# Patient Record
Sex: Female | Born: 1940
Health system: Southern US, Community
[De-identification: ages and names within clinical notes are randomized; demographics above are authoritative.]

## PROBLEM LIST (undated history)

## (undated) DIAGNOSIS — I10 Essential (primary) hypertension: Secondary | ICD-10-CM

## (undated) DIAGNOSIS — K219 Gastro-esophageal reflux disease without esophagitis: Secondary | ICD-10-CM

## (undated) DIAGNOSIS — M1990 Unspecified osteoarthritis, unspecified site: Secondary | ICD-10-CM

## (undated) DIAGNOSIS — M8580 Other specified disorders of bone density and structure, unspecified site: Secondary | ICD-10-CM

## (undated) DIAGNOSIS — E7800 Pure hypercholesterolemia, unspecified: Secondary | ICD-10-CM

## (undated) HISTORY — DX: Other specified disorders of bone density and structure, unspecified site: M85.80

## (undated) HISTORY — PX: VAGINAL HYSTERECTOMY: SUR661

## (undated) HISTORY — DX: Pure hypercholesterolemia, unspecified: E78.00

## (undated) HISTORY — DX: Essential (primary) hypertension: I10

---

## 1999-11-01 ENCOUNTER — Emergency Department (HOSPITAL_COMMUNITY): Admission: EM | Admit: 1999-11-01 | Discharge: 1999-11-01 | Payer: Self-pay | Admitting: Emergency Medicine

## 1999-11-02 ENCOUNTER — Encounter: Payer: Self-pay | Admitting: Emergency Medicine

## 2000-05-05 ENCOUNTER — Encounter: Payer: Self-pay | Admitting: Family Medicine

## 2000-05-05 ENCOUNTER — Encounter: Admission: RE | Admit: 2000-05-05 | Discharge: 2000-05-05 | Payer: Self-pay | Admitting: Family Medicine

## 2002-12-12 ENCOUNTER — Other Ambulatory Visit: Admission: RE | Admit: 2002-12-12 | Discharge: 2002-12-12 | Payer: Self-pay | Admitting: Family Medicine

## 2003-03-27 ENCOUNTER — Other Ambulatory Visit: Admission: RE | Admit: 2003-03-27 | Discharge: 2003-03-27 | Payer: Self-pay | Admitting: Family Medicine

## 2004-09-29 ENCOUNTER — Other Ambulatory Visit: Admission: RE | Admit: 2004-09-29 | Discharge: 2004-09-29 | Payer: Self-pay | Admitting: Addiction Medicine

## 2006-02-16 ENCOUNTER — Other Ambulatory Visit: Admission: RE | Admit: 2006-02-16 | Discharge: 2006-02-16 | Payer: Self-pay | Admitting: Obstetrics and Gynecology

## 2006-11-01 ENCOUNTER — Encounter: Admission: RE | Admit: 2006-11-01 | Discharge: 2006-11-01 | Payer: Self-pay | Admitting: Family Medicine

## 2007-04-04 ENCOUNTER — Other Ambulatory Visit: Admission: RE | Admit: 2007-04-04 | Discharge: 2007-04-04 | Payer: Self-pay | Admitting: Obstetrics and Gynecology

## 2008-05-08 ENCOUNTER — Encounter: Payer: Self-pay | Admitting: Obstetrics and Gynecology

## 2008-05-08 ENCOUNTER — Other Ambulatory Visit: Admission: RE | Admit: 2008-05-08 | Discharge: 2008-05-08 | Payer: Self-pay | Admitting: Obstetrics and Gynecology

## 2008-05-08 ENCOUNTER — Ambulatory Visit: Payer: Self-pay | Admitting: Obstetrics and Gynecology

## 2009-05-14 ENCOUNTER — Ambulatory Visit: Payer: Self-pay | Admitting: Obstetrics and Gynecology

## 2009-05-14 ENCOUNTER — Encounter: Payer: Self-pay | Admitting: Obstetrics and Gynecology

## 2009-05-14 ENCOUNTER — Other Ambulatory Visit: Admission: RE | Admit: 2009-05-14 | Discharge: 2009-05-14 | Payer: Self-pay | Admitting: Obstetrics and Gynecology

## 2009-05-21 ENCOUNTER — Encounter: Admission: RE | Admit: 2009-05-21 | Discharge: 2009-05-21 | Payer: Self-pay | Admitting: Internal Medicine

## 2009-05-22 ENCOUNTER — Ambulatory Visit: Payer: Self-pay | Admitting: Obstetrics and Gynecology

## 2009-06-18 ENCOUNTER — Ambulatory Visit: Payer: Self-pay | Admitting: Obstetrics and Gynecology

## 2010-04-09 ENCOUNTER — Observation Stay (HOSPITAL_COMMUNITY): Admission: EM | Admit: 2010-04-09 | Discharge: 2010-04-10 | Payer: Self-pay | Admitting: Emergency Medicine

## 2010-04-24 ENCOUNTER — Encounter: Admission: RE | Admit: 2010-04-24 | Discharge: 2010-04-24 | Payer: Self-pay | Admitting: Obstetrics and Gynecology

## 2010-04-28 ENCOUNTER — Other Ambulatory Visit: Admission: RE | Admit: 2010-04-28 | Discharge: 2010-04-28 | Payer: Self-pay | Admitting: Obstetrics and Gynecology

## 2010-04-28 ENCOUNTER — Ambulatory Visit: Payer: Self-pay | Admitting: Obstetrics and Gynecology

## 2010-09-18 LAB — CARDIAC PANEL(CRET KIN+CKTOT+MB+TROPI)
CK, MB: 0.9 ng/mL (ref 0.3–4.0)
CK, MB: 1.1 ng/mL (ref 0.3–4.0)
Relative Index: INVALID (ref 0.0–2.5)
Relative Index: INVALID (ref 0.0–2.5)
Total CK: 22 U/L (ref 7–177)
Total CK: 23 U/L (ref 7–177)
Troponin I: 0.01 ng/mL (ref 0.00–0.06)
Troponin I: 0.02 ng/mL (ref 0.00–0.06)

## 2010-09-18 LAB — DIFFERENTIAL
Basophils Absolute: 0 10*3/uL (ref 0.0–0.1)
Basophils Relative: 0 % (ref 0–1)
Eosinophils Absolute: 0.1 10*3/uL (ref 0.0–0.7)
Eosinophils Relative: 1 % (ref 0–5)
Lymphocytes Relative: 31 % (ref 12–46)
Lymphs Abs: 2.9 10*3/uL (ref 0.7–4.0)
Monocytes Absolute: 0.8 10*3/uL (ref 0.1–1.0)
Monocytes Relative: 8 % (ref 3–12)
Neutro Abs: 5.5 10*3/uL (ref 1.7–7.7)
Neutrophils Relative %: 60 % (ref 43–77)

## 2010-09-18 LAB — POCT CARDIAC MARKERS
CKMB, poc: <1 ng/mL — ABNORMAL LOW (ref 1.0–8.0)
CKMB, poc: <1 ng/mL — ABNORMAL LOW (ref 1.0–8.0)
Myoglobin, poc: 46 ng/mL (ref 12–200)
Myoglobin, poc: 56.1 ng/mL (ref 12–200)
Troponin i, poc: <0.05 ng/mL (ref 0.00–0.09)
Troponin i, poc: <0.05 ng/mL (ref 0.00–0.09)

## 2010-09-18 LAB — PROTIME-INR
INR: 0.83 (ref 0.00–1.49)
Prothrombin Time: 11.6 seconds (ref 11.6–15.2)

## 2010-09-18 LAB — CK TOTAL AND CKMB (NOT AT ARMC)
CK, MB: 1.1 ng/mL (ref 0.3–4.0)
Relative Index: INVALID (ref 0.0–2.5)
Total CK: 26 U/L (ref 7–177)

## 2010-09-18 LAB — LIPID PANEL
Cholesterol: 196 mg/dL (ref 0–200)
HDL: 57 mg/dL (ref >39)
LDL Cholesterol: UNDETERMINED mg/dL (ref 0–99)
Total CHOL/HDL Ratio: 3.4 RATIO
Triglycerides: 434 mg/dL — ABNORMAL HIGH (ref <150)
VLDL: UNDETERMINED mg/dL (ref 0–40)

## 2010-09-18 LAB — CBC
HCT: 42 % (ref 36.0–46.0)
Hemoglobin: 14.9 g/dL (ref 12.0–15.0)
MCH: 33 pg (ref 26.0–34.0)
MCHC: 35.5 g/dL (ref 30.0–36.0)
MCV: 92.9 fL (ref 78.0–100.0)
Platelets: 271 10*3/uL (ref 150–400)
RBC: 4.52 MIL/uL (ref 3.87–5.11)
RDW: 12.4 % (ref 11.5–15.5)
WBC: 9.3 10*3/uL (ref 4.0–10.5)

## 2010-09-18 LAB — POCT I-STAT, CHEM 8
BUN: 13 mg/dL (ref 6–23)
Calcium, Ion: 1.13 mmol/L (ref 1.12–1.32)
Chloride: 96 mEq/L (ref 96–112)
Creatinine, Ser: 0.8 mg/dL (ref 0.4–1.2)
Glucose, Bld: 129 mg/dL — ABNORMAL HIGH (ref 70–99)
HCT: 45 % (ref 36.0–46.0)
Hemoglobin: 15.3 g/dL — ABNORMAL HIGH (ref 12.0–15.0)
Potassium: 3.6 mEq/L (ref 3.5–5.1)
Sodium: 134 mEq/L — ABNORMAL LOW (ref 135–145)
TCO2: 33 mmol/L (ref 0–100)

## 2010-09-18 LAB — TROPONIN I: Troponin I: 0.03 ng/mL (ref 0.00–0.06)

## 2010-09-18 LAB — D-DIMER, QUANTITATIVE: D-Dimer, Quant: 0.44 ug/mL-FEU (ref 0.00–0.48)

## 2011-04-09 ENCOUNTER — Other Ambulatory Visit: Payer: Self-pay | Admitting: Obstetrics and Gynecology

## 2011-04-09 DIAGNOSIS — Z1231 Encounter for screening mammogram for malignant neoplasm of breast: Secondary | ICD-10-CM

## 2011-04-28 ENCOUNTER — Encounter: Payer: Self-pay | Admitting: Gynecology

## 2011-04-28 DIAGNOSIS — I10 Essential (primary) hypertension: Secondary | ICD-10-CM | POA: Insufficient documentation

## 2011-05-06 ENCOUNTER — Ambulatory Visit
Admission: RE | Admit: 2011-05-06 | Discharge: 2011-05-06 | Disposition: A | Discharge status: Home / Self Care | Payer: BC Managed Care – PPO | Source: Ambulatory Visit | Attending: Obstetrics and Gynecology | Admitting: Obstetrics and Gynecology

## 2011-05-06 ENCOUNTER — Encounter: Payer: Self-pay | Admitting: Obstetrics and Gynecology

## 2011-05-06 DIAGNOSIS — Z1231 Encounter for screening mammogram for malignant neoplasm of breast: Secondary | ICD-10-CM

## 2011-05-25 ENCOUNTER — Encounter: Payer: Self-pay | Admitting: Obstetrics and Gynecology

## 2011-05-25 ENCOUNTER — Ambulatory Visit (INDEPENDENT_AMBULATORY_CARE_PROVIDER_SITE_OTHER): Payer: BC Managed Care – PPO | Admitting: Obstetrics and Gynecology

## 2011-05-25 ENCOUNTER — Other Ambulatory Visit (HOSPITAL_COMMUNITY)
Admission: RE | Admit: 2011-05-25 | Discharge: 2011-05-25 | Disposition: A | Discharge status: Home / Self Care | Payer: BC Managed Care – PPO | Source: Ambulatory Visit | Attending: Obstetrics and Gynecology | Admitting: Obstetrics and Gynecology

## 2011-05-25 VITALS — BP 122/74 | Ht 62.0 in | Wt 108.0 lb

## 2011-05-25 DIAGNOSIS — Z780 Asymptomatic menopausal state: Secondary | ICD-10-CM

## 2011-05-25 DIAGNOSIS — M949 Disorder of cartilage, unspecified: Secondary | ICD-10-CM

## 2011-05-25 DIAGNOSIS — Z01419 Encounter for gynecological examination (general) (routine) without abnormal findings: Secondary | ICD-10-CM | POA: Insufficient documentation

## 2011-05-25 DIAGNOSIS — N951 Menopausal and female climacteric states: Secondary | ICD-10-CM

## 2011-05-25 DIAGNOSIS — M8580 Other specified disorders of bone density and structure, unspecified site: Secondary | ICD-10-CM

## 2011-05-25 DIAGNOSIS — K219 Gastro-esophageal reflux disease without esophagitis: Secondary | ICD-10-CM

## 2011-05-25 DIAGNOSIS — R351 Nocturia: Secondary | ICD-10-CM

## 2011-05-25 DIAGNOSIS — M899 Disorder of bone, unspecified: Secondary | ICD-10-CM

## 2011-05-25 DIAGNOSIS — Z23 Encounter for immunization: Secondary | ICD-10-CM

## 2011-05-25 MED ORDER — ESTRADIOL ACETATE 0.05 MG/24HR VA RING: VAGINAL_RING | VAGINAL | Status: DC

## 2011-05-25 NOTE — Progress Notes (Signed)
Subjective:     Patient ID: Susan Gallegos, female   DOB: 03/06/1941, 70 y.o.   MRN: [id number]  HPIpatient came back to see me today for further followup. She is on a Femring for relief of vasomotor symptoms and vaginal dryness. Is working well. She is having no vaginal bleeding. She is having no pelvic pain. She does have low bone mass and is due for a followup bone density the beginning of the year. She is having yearly mammograms. She takes calcium and vitamin D. She's had no fractures. She has nocturia x1 without dysuria frequency or urgency.   Review of Systems  Constitutional: Negative.   HENT: Negative.   Eyes: Negative.   Respiratory: Negative.   Cardiovascular: Positive for chest pain.       Chest pain lead to a  diagnosis of GERD. Now being treated. Also on a diuretic for hypertension.  Gastrointestinal: Negative.   Genitourinary: Negative.   Musculoskeletal: Negative.   Skin: Negative.   Neurological: Negative.   Hematological: Negative.   Psychiatric/Behavioral: Negative.        Objective:   Physical ExamHEENT: Within normal limits.   Kin Julian Reil present Neck: No masses. Supraclavicular lymph nodes: Not enlarged. Breasts: Examined in both sitting and lying position. Symmetrical without skin changes or masses. Abdomen: Soft no masses guarding or rebound. No hernias. Pelvic: External within normal limits. BUS within normal limits. Vaginal examination shows good estrogen effect, no cystocele enterocele or rectocele. Cervix and uterus absent. Adnexa within normal limits. Rectovaginal confirmatory. Extremities within normal limits.      Assessment:     #1. Atrophic vaginitis #2. Menopausal symptoms #3. Low bone mass #4. Nocturia   Plan:     Continue Femring 0.05 mg one every 90 days. Continue yearly mammograms. Bone density January 2013. Lab work through PCP. Once again had a long discussion of pros and cons of HRT. Since patient is symptomatic and uses vaginal estrogen  without progesterone we both think benefits outweigh risks.

## 2012-04-14 ENCOUNTER — Other Ambulatory Visit: Payer: Self-pay | Admitting: Obstetrics and Gynecology

## 2012-04-14 DIAGNOSIS — Z1231 Encounter for screening mammogram for malignant neoplasm of breast: Secondary | ICD-10-CM

## 2012-05-25 ENCOUNTER — Ambulatory Visit (INDEPENDENT_AMBULATORY_CARE_PROVIDER_SITE_OTHER): Payer: BC Managed Care – PPO | Admitting: Obstetrics and Gynecology

## 2012-05-25 ENCOUNTER — Encounter: Payer: Self-pay | Admitting: Obstetrics and Gynecology

## 2012-05-25 ENCOUNTER — Ambulatory Visit
Admission: RE | Admit: 2012-05-25 | Discharge: 2012-05-25 | Disposition: A | Discharge status: Home / Self Care | Payer: BC Managed Care – PPO | Source: Ambulatory Visit | Attending: Obstetrics and Gynecology | Admitting: Obstetrics and Gynecology

## 2012-05-25 VITALS — BP 120/68 | Ht 62.0 in | Wt 108.0 lb

## 2012-05-25 DIAGNOSIS — Z01419 Encounter for gynecological examination (general) (routine) without abnormal findings: Secondary | ICD-10-CM

## 2012-05-25 DIAGNOSIS — Z23 Encounter for immunization: Secondary | ICD-10-CM

## 2012-05-25 DIAGNOSIS — E7800 Pure hypercholesterolemia, unspecified: Secondary | ICD-10-CM | POA: Insufficient documentation

## 2012-05-25 DIAGNOSIS — Z1231 Encounter for screening mammogram for malignant neoplasm of breast: Secondary | ICD-10-CM

## 2012-05-25 MED ORDER — ESTRADIOL ACETATE 0.05 MG/24HR VA RING: VAGINAL_RING | VAGINAL | Status: DC

## 2012-05-25 NOTE — Progress Notes (Signed)
Patient came to see me today for her annual GYN exam. She remains on Femring for control of her menopausal symptoms. She does well on it. She has never had an abnormal Pap smear. Her last Pap smear was 2012. She has been told repetitively to have a colonoscopy both by me and her internist. So far she has not gone. She has scheduled her mammogram. She had a vaginal hysterectomy in 1979 for dysfunctional uterine bleeding and pelvic pain. She does her lab through her PCP. On bone density she has stable low bone mass without an elevated fracture risk. Her last bone density was 2010. She is having no vaginal bleeding. She is having no pelvic pain.  HEENT: Within normal limits.Kennon Portela present. Neck: No masses. Supraclavicular lymph nodes: Not enlarged. Breasts: Examined in both sitting and lying position. Symmetrical without skin changes or masses. Abdomen: Soft no masses guarding or rebound. No hernias. Pelvic: External within normal limits. BUS within normal limits. Vaginal examination shows good estrogen effect, no cystocele enterocele or rectocele. Cervix and uterus absent. Adnexa within normal limits. Rectovaginal confirmatory. Extremities within normal limits.  Assessment: #1. Osteopenia #2. Menopausal symptoms  Plan: Continue Femring. Continue yearly mammograms. Schedule  bone density and colonoscopy. Pap not done.The new Pap smear guidelines were discussed with the patient.

## 2012-05-25 NOTE — Patient Instructions (Addendum)
Please schedule colonoscopy. Please scheduled bone density.

## 2012-05-25 NOTE — Addendum Note (Signed)
Addended by: Dayna Barker on: 05/25/2012 12:22 PM   Modules accepted: Orders

## 2012-06-06 ENCOUNTER — Encounter: Payer: Self-pay | Admitting: Obstetrics and Gynecology

## 2012-07-04 ENCOUNTER — Emergency Department (INDEPENDENT_AMBULATORY_CARE_PROVIDER_SITE_OTHER): Payer: BC Managed Care – PPO

## 2012-07-04 ENCOUNTER — Emergency Department (HOSPITAL_COMMUNITY)
Admission: EM | Admit: 2012-07-04 | Discharge: 2012-07-04 | Disposition: A | Discharge status: Home / Self Care | Payer: BC Managed Care – PPO | Source: Home / Self Care | Attending: Emergency Medicine | Admitting: Emergency Medicine

## 2012-07-04 ENCOUNTER — Encounter (HOSPITAL_COMMUNITY): Payer: Self-pay

## 2012-07-04 DIAGNOSIS — M545 Low back pain, unspecified: Secondary | ICD-10-CM

## 2012-07-04 DIAGNOSIS — Q762 Congenital spondylolisthesis: Secondary | ICD-10-CM

## 2012-07-04 DIAGNOSIS — M4310 Spondylolisthesis, site unspecified: Secondary | ICD-10-CM

## 2012-07-04 MED ORDER — CYCLOBENZAPRINE HCL 5 MG PO TABS: 5.0000 mg | ORAL_TABLET | Freq: Three times a day (TID) | ORAL | Status: AC | PRN

## 2012-07-04 MED ORDER — HYDROCODONE-IBUPROFEN 7.5-200 MG PO TABS: 1.0000 | ORAL_TABLET | Freq: Three times a day (TID) | ORAL | Status: AC | PRN

## 2012-07-04 NOTE — ED Notes (Addendum)
C/o pain in back for past couple of days; pain worse w sitting for prolonged time, better w standing or walking ; in oast , this pain relieved w tylenol, and using husband's TENS unit, but not responding as well as usual; stood for 2 hours yesterday at a reception Reportedly has episode like this about 1 x yr

## 2012-07-04 NOTE — ED Provider Notes (Addendum)
History     CSN: [id number]  Arrival date & time 07/04/12  1036   First MD Initiated Contact with Patient 07/04/12 1210      Chief Complaint  Patient presents with  . Back Pain    (Consider location/radiation/quality/duration/timing/severity/associated sxs/prior treatment) HPI Comments: Patient presents urgent care describing that Saturday she started with a bit of discomfort in her lower back and yesterday as the day progressed pain has gotten progressively worse and at this point with sudden movement and specially in a sitting position she experiences a sharp pain on her lower back. This pain does not radiate anywhere, denies any numbness, tingling sensation or weakness of her lower extremities. Denies any urinary symptoms, incontinence or changes in her bowel movement patterns. She also denies any abdominal pain, or constitutional symptoms such as fevers, generalized malaise, or unintentional weight loss.  She adds and describes it for several months she has had this pain on and off, and denies any previous injuries, falls or changes in her physical activities that could explain his pain.   Nurse brings to my attention the patient also described to her that she has some degree of pain and discomfort with movement to her lower abdominal area or suprapubic region when she moves. " almost like an anterior radiation of her back pain"  Patient is a 71 y.o. female presenting with back pain. The history is provided by the patient.  Back Pain  This is a recurrent problem. The current episode started yesterday. The problem occurs constantly. The problem has been gradually worsening. The pain is associated with no known injury. The pain is present in the lumbar spine. The quality of the pain is described as stabbing. The pain does not radiate. The pain is at a severity of 8/10. The pain is moderate. Pertinent negatives include no chest pain, no fever, no numbness, no weight loss, no bowel  incontinence, no perianal numbness, no bladder incontinence, no dysuria, no pelvic pain, no paresthesias, no paresis, no tingling and no weakness. She has tried nothing for the symptoms.    Past Medical History  Diagnosis Date  . Elevated cholesterol   . Hypertension     Past Surgical History  Procedure Date  . Vaginal hysterectomy     Family History  Problem Relation Age of Onset  . Uterine cancer Mother   . Diabetes Father   . Heart disease Father   . Diabetes Sister   . Stroke Sister   . Diabetes Brother     History  Substance Use Topics  . Smoking status: Never Smoker   . Smokeless tobacco: Not on file  . Alcohol Use: No    OB History    Grav Para Term Preterm Abortions TAB SAB Ect Mult Living   3 2 2  1  1   2       Review of Systems  Constitutional: Negative for fever, chills, weight loss, diaphoresis, activity change and appetite change.  HENT: Negative for neck pain and neck stiffness.   Respiratory: Negative for shortness of breath.   Cardiovascular: Negative for chest pain and leg swelling.  Gastrointestinal: Negative for bowel incontinence.  Genitourinary: Negative for bladder incontinence, dysuria and pelvic pain.  Musculoskeletal: Positive for back pain. Negative for myalgias, joint swelling, arthralgias and gait problem.  Neurological: Negative for tingling, weakness, numbness and paresthesias.    Allergies  Codeine  Home Medications   Current Outpatient Rx  Name  Route  Sig  Dispense  Refill  .  CALCIUM + D PO   Oral   Take by mouth 2 (two) times daily.           Marland Kitchen ESTRADIOL ACETATE 0.05 MG/24HR VA RING      One every three months   0.05 each   4   . ZETIA PO   Oral   Take by mouth.           Marland Kitchen HYDROCHLOROTHIAZIDE 25 MG PO TABS   Oral   Take 25 mg by mouth daily. Take 1/2 pill          . ONE-DAILY MULTI VITAMINS PO TABS   Oral   Take 1 tablet by mouth daily.           . CYCLOBENZAPRINE HCL 5 MG PO TABS   Oral   Take 1  tablet (5 mg total) by mouth 3 (three) times daily as needed for muscle spasms.   20 tablet   0   . HYDROCODONE-IBUPROFEN 7.5-200 MG PO TABS   Oral   Take 1 tablet by mouth every 8 (eight) hours as needed for pain.   15 tablet   0     BP 179/82  Pulse 80  Temp 98.1 F (36.7 C) (Oral)  Resp 12  SpO2 98%  Physical Exam  Nursing note and vitals reviewed. Constitutional: She is oriented to person, place, and time. She appears well-developed and well-nourished. No distress.  Pulmonary/Chest: Effort normal and breath sounds normal.  Abdominal: Soft. Bowel sounds are normal. She exhibits no distension. There is no tenderness. There is no rebound.  Musculoskeletal: She exhibits tenderness.       Back:  Neurological: She is alert and oriented to person, place, and time.  Skin: Skin is warm. Rash noted. No erythema.    ED Course  Procedures (including critical care time)  Labs Reviewed - No data to display Dg Lumbar Spine Complete  07/04/2012  *RADIOLOGY REPORT*  Clinical Data: Mid and lower back pain.  LUMBAR SPINE - COMPLETE 4+ VIEW  Comparison: None.  Findings: No fracture.  Grade 1 anterolisthesis of L4-5 is associated loss of disc height at the same level.  Lower lumbar facets are degenerated bilaterally.  SI joints are unremarkable.  IMPRESSION: Spondylolisthesis at L4-5.  No acute findings.   Original Report Authenticated By: Kennith Center, M.D.      1. Anterolisthesis   2. Lumbar back pain       Performed an informational ( nondiagnostic) , bedside ultrasound able to visualize the proximal middle and distal segments of aorta with no obvious signs of a aneurysm- as able to visualize a full bladder no irregularities. MDM  Retro-listhesis-L4-L5         Jimmie Molly, MD 07/04/12 1244  Jimmie Molly, MD 07/04/12 [phone]

## 2012-07-07 ENCOUNTER — Other Ambulatory Visit: Payer: Self-pay | Admitting: Women's Health

## 2012-07-07 DIAGNOSIS — M8580 Other specified disorders of bone density and structure, unspecified site: Secondary | ICD-10-CM

## 2012-09-07 ENCOUNTER — Emergency Department (HOSPITAL_COMMUNITY): Payer: Medicare Other

## 2012-09-07 ENCOUNTER — Encounter (HOSPITAL_COMMUNITY): Payer: Self-pay | Admitting: *Deleted

## 2012-09-07 ENCOUNTER — Observation Stay (HOSPITAL_COMMUNITY)
Admission: EM | Admit: 2012-09-07 | Discharge: 2012-09-08 | Disposition: A | Discharge status: Home / Self Care | Payer: Medicare Other | Attending: Internal Medicine | Admitting: Internal Medicine

## 2012-09-07 DIAGNOSIS — I10 Essential (primary) hypertension: Secondary | ICD-10-CM | POA: Diagnosis present

## 2012-09-07 DIAGNOSIS — R0789 Other chest pain: Principal | ICD-10-CM

## 2012-09-07 DIAGNOSIS — K219 Gastro-esophageal reflux disease without esophagitis: Secondary | ICD-10-CM | POA: Diagnosis present

## 2012-09-07 DIAGNOSIS — E7800 Pure hypercholesterolemia, unspecified: Secondary | ICD-10-CM | POA: Diagnosis present

## 2012-09-07 HISTORY — DX: Unspecified osteoarthritis, unspecified site: M19.90

## 2012-09-07 HISTORY — DX: Gastro-esophageal reflux disease without esophagitis: K21.9

## 2012-09-07 LAB — CBC
HCT: 34.6 % — ABNORMAL LOW (ref 36.0–46.0)
HCT: 31.5 % — ABNORMAL LOW (ref 36.0–46.0)
Hemoglobin: 12.7 g/dL (ref 12.0–15.0)
Hemoglobin: 11.5 g/dL — ABNORMAL LOW (ref 12.0–15.0)
MCH: 32.2 pg (ref 26.0–34.0)
MCH: 32.1 pg (ref 26.0–34.0)
MCHC: 36.7 g/dL — ABNORMAL HIGH (ref 30.0–36.0)
MCHC: 36.5 g/dL — ABNORMAL HIGH (ref 30.0–36.0)
MCV: 87.8 fL (ref 78.0–100.0)
MCV: 88 fL (ref 78.0–100.0)
Platelets: 261 10*3/uL (ref 150–400)
Platelets: 219 10*3/uL (ref 150–400)
RBC: 3.94 MIL/uL (ref 3.87–5.11)
RBC: 3.58 MIL/uL — ABNORMAL LOW (ref 3.87–5.11)
RDW: 12 % (ref 11.5–15.5)
RDW: 12.1 % (ref 11.5–15.5)
WBC: 15.5 10*3/uL — ABNORMAL HIGH (ref 4.0–10.5)
WBC: 14.1 10*3/uL — ABNORMAL HIGH (ref 4.0–10.5)

## 2012-09-07 LAB — BASIC METABOLIC PANEL
BUN: 17 mg/dL (ref 6–23)
CO2: 32 mEq/L (ref 19–32)
Calcium: 10 mg/dL (ref 8.4–10.5)
Chloride: 98 mEq/L (ref 96–112)
Creatinine, Ser: 0.68 mg/dL (ref 0.50–1.10)
GFR calc Af Amer: >90 mL/min (ref >90)
GFR calc non Af Amer: 86 mL/min — ABNORMAL LOW (ref >90)
Glucose, Bld: 144 mg/dL — ABNORMAL HIGH (ref 70–99)
Potassium: 4 mEq/L (ref 3.5–5.1)
Sodium: 139 mEq/L (ref 135–145)

## 2012-09-07 LAB — POCT I-STAT TROPONIN I
Troponin i, poc: 0 ng/mL (ref 0.00–0.08)
Troponin i, poc: 0 ng/mL (ref 0.00–0.08)

## 2012-09-07 LAB — CREATININE, SERUM
Creatinine, Ser: 0.6 mg/dL (ref 0.50–1.10)
GFR calc Af Amer: >90 mL/min (ref >90)
GFR calc non Af Amer: 90 mL/min — ABNORMAL LOW (ref >90)

## 2012-09-07 LAB — TSH: TSH: 1.273 u[IU]/mL (ref 0.350–4.500)

## 2012-09-07 LAB — TROPONIN I
Troponin I: <0.3 ng/mL (ref <0.30)
Troponin I: <0.3 ng/mL (ref <0.30)
Troponin I: <0.3 ng/mL (ref <0.30)

## 2012-09-07 MED ORDER — DOCUSATE SODIUM 100 MG PO CAPS
100.0000 mg | ORAL_CAPSULE | Freq: Two times a day (BID) | ORAL | Status: DC
  Administered 2012-09-07: 100 mg via ORAL
  Filled 2012-09-07 (×3): qty 1

## 2012-09-07 MED ORDER — METOPROLOL TARTRATE 25 MG PO TABS
25.0000 mg | ORAL_TABLET | Freq: Once | ORAL | Status: AC
  Administered 2012-09-07: 25 mg via ORAL
  Filled 2012-09-07: qty 1

## 2012-09-07 MED ORDER — NITROGLYCERIN 2 % TD OINT
1.0000 [in_us] | TOPICAL_OINTMENT | Freq: Once | TRANSDERMAL | Status: AC
  Administered 2012-09-07: 1 [in_us] via TOPICAL
  Filled 2012-09-07: qty 1

## 2012-09-07 MED ORDER — ASPIRIN EC 325 MG PO TBEC
325.0000 mg | DELAYED_RELEASE_TABLET | Freq: Every day | ORAL | Status: DC
  Administered 2012-09-07: 325 mg via ORAL
  Filled 2012-09-07 (×3): qty 1

## 2012-09-07 MED ORDER — PANTOPRAZOLE SODIUM 40 MG PO TBEC
40.0000 mg | DELAYED_RELEASE_TABLET | Freq: Every day | ORAL | Status: DC
  Administered 2012-09-07 – 2012-09-08 (×2): 40 mg via ORAL
  Filled 2012-09-07 (×2): qty 1

## 2012-09-07 MED ORDER — ONDANSETRON HCL 4 MG PO TABS: 4.0000 mg | ORAL_TABLET | Freq: Four times a day (QID) | ORAL | Status: DC | PRN

## 2012-09-07 MED ORDER — METOPROLOL TARTRATE 25 MG PO TABS
25.0000 mg | ORAL_TABLET | Freq: Two times a day (BID) | ORAL | Status: DC
  Administered 2012-09-07 (×2): 25 mg via ORAL
  Filled 2012-09-07 (×5): qty 1

## 2012-09-07 MED ORDER — GI COCKTAIL ~~LOC~~
30.0000 mL | Freq: Once | ORAL | Status: AC
  Administered 2012-09-07: 30 mL via ORAL
  Filled 2012-09-07: qty 30

## 2012-09-07 MED ORDER — ENOXAPARIN SODIUM 40 MG/0.4ML ~~LOC~~ SOLN
40.0000 mg | SUBCUTANEOUS | Status: DC
  Administered 2012-09-07: 40 mg via SUBCUTANEOUS
  Filled 2012-09-07 (×4): qty 0.4

## 2012-09-07 MED ORDER — SODIUM CHLORIDE 0.9 % IJ SOLN: 3.0000 mL | INTRAMUSCULAR | Status: DC | PRN

## 2012-09-07 MED ORDER — MORPHINE SULFATE 4 MG/ML IJ SOLN
4.0000 mg | Freq: Once | INTRAMUSCULAR | Status: AC
  Administered 2012-09-07: 4 mg via INTRAVENOUS
  Filled 2012-09-07: qty 1

## 2012-09-07 MED ORDER — SODIUM CHLORIDE 0.9 % IJ SOLN
3.0000 mL | Freq: Two times a day (BID) | INTRAMUSCULAR | Status: DC
  Administered 2012-09-07: 3 mL via INTRAVENOUS

## 2012-09-07 MED ORDER — ONDANSETRON HCL 4 MG/2ML IJ SOLN: 4.0000 mg | Freq: Four times a day (QID) | INTRAMUSCULAR | Status: DC | PRN

## 2012-09-07 MED ORDER — HYDROMORPHONE HCL PF 1 MG/ML IJ SOLN
1.0000 mg | INTRAMUSCULAR | Status: DC | PRN
  Administered 2012-09-07: 1 mg via INTRAVENOUS

## 2012-09-07 MED ORDER — ATORVASTATIN CALCIUM 10 MG PO TABS
10.0000 mg | ORAL_TABLET | Freq: Every day | ORAL | Status: DC
  Administered 2012-09-07: 10 mg via ORAL
  Filled 2012-09-07 (×3): qty 1

## 2012-09-07 MED ORDER — SODIUM CHLORIDE 0.9 % IV SOLN: 250.0000 mL | INTRAVENOUS | Status: DC | PRN

## 2012-09-07 MED ORDER — HYDROMORPHONE HCL PF 1 MG/ML IJ SOLN
INTRAMUSCULAR | Status: AC
  Filled 2012-09-07: qty 1

## 2012-09-07 MED ORDER — ONDANSETRON HCL 4 MG/2ML IJ SOLN
4.0000 mg | Freq: Once | INTRAMUSCULAR | Status: AC
  Administered 2012-09-07: 4 mg via INTRAVENOUS
  Filled 2012-09-07: qty 2

## 2012-09-07 MED ORDER — NITROGLYCERIN 2 % TD OINT
1.0000 [in_us] | TOPICAL_OINTMENT | Freq: Three times a day (TID) | TRANSDERMAL | Status: DC
  Administered 2012-09-07 – 2012-09-08 (×3): 1 [in_us] via TOPICAL
  Filled 2012-09-07 (×2): qty 30

## 2012-09-07 NOTE — ED Notes (Signed)
Per Dr. Judd Lien, hold Nitro & Zofran for now to see how patient responds to GI cocktail.

## 2012-09-07 NOTE — ED Notes (Signed)
Patient reports minimal improvement in pain. Nitro & Zofran administered. Dr. Judd Lien notified

## 2012-09-07 NOTE — ED Notes (Signed)
Patient transported to X-ray 

## 2012-09-07 NOTE — ED Notes (Signed)
Patient reports that chest pain has returned. Rates 10/10 and described as throbbing. No sob, diaphoresis, limb pain, n/v or abd pain. VSS. Dr.Delo notified

## 2012-09-07 NOTE — H&P (Signed)
[facility] History and Physical  Susan Gallegos ZOX:[id number] DOB: Apr 17, 1941    PCP:   Darnelle Bos, MD   Chief Complaint: chest pain.  HPI: Susan Gallegos is an 72 y.o. female with hx of GERD, hyperlipidemia, HTN, presents to the ER with chest pain.  She was sleeping and woke up with substernal chest pain which she said was rather severe.  She denied any shortness of breath, but had nausea and vomited once.  She had no diaphoresis.  She actually had 2 episodes of similar chest pain, just not as severe.  One in 1998 and another in 2011, where she did have a nuclear stress test and was told it was negative.  She doesn't have pain with eating.  NTG SL was given without any difference, and it made her nauseated.  EKG showed NSR and no ischemic changes.  Her CXR was negative and her troponin was negative as well.  Hospitalist was asked to admit her to the hospital for chest pain.    Rewiew of Systems:  Constitutional: Negative for malaise, fever and chills. No significant weight loss or weight gain Eyes: Negative for eye pain, redness and discharge, diplopia, visual changes, or flashes of light. ENMT: Negative for ear pain, hoarseness, nasal congestion, sinus pressure and sore throat. No headaches; tinnitus, drooling, or problem swallowing. Cardiovascular: Negative for  palpitations, diaphoresis, dyspnea and peripheral edema. ; No orthopnea, PND Respiratory: Negative for cough, hemoptysis, wheezing and stridor. No pleuritic chestpain. Gastrointestinal: Negative for diarrhea, constipation, abdominal pain, melena, blood in stool, hematemesis, jaundice and rectal bleeding.    Genitourinary: Negative for frequency, dysuria, incontinence,flank pain and hematuria; Musculoskeletal: Negative for back pain and neck pain. Negative for swelling and trauma.;  Skin: . Negative for pruritus, rash, abrasions, bruising and skin lesion.; ulcerations Neuro: Negative for headache, lightheadedness and  neck stiffness. Negative for weakness, altered level of consciousness , altered mental status, extremity weakness, burning feet, involuntary movement, seizure and syncope.  Psych: negative for anxiety, depression, insomnia, tearfulness, panic attacks, hallucinations, paranoia, suicidal or homicidal ideation   Past Medical History  Diagnosis Date  . Elevated cholesterol   . Hypertension   . GERD (gastroesophageal reflux disease)     Past Surgical History  Procedure Laterality Date  . Vaginal hysterectomy      Medications:  HOME MEDS: Prior to Admission medications   Medication Sig Start Date End Date Taking? Authorizing Susan Gallegos  Calcium Carbonate-Vitamin D (CALCIUM + D PO) Take by mouth 2 (two) times daily.      Historical Susan Kise, MD  Estradiol Acetate (FEMRING) 0.05 MG/24HR RING One every three months 05/25/12   Susan Paganini, MD  Ezetimibe (ZETIA PO) Take by mouth.      Historical Susan Fayette, MD  hydrochlorothiazide (HYDRODIURIL) 25 MG tablet Take 25 mg by mouth daily. Take 1/2 pill     Historical Susan Comins, MD  Multiple Vitamin (MULTIVITAMIN) tablet Take 1 tablet by mouth daily.      Historical Susan Mckenzie, MD     Allergies:  Allergies  Allergen Reactions  . Codeine     Social History:   reports that she has never smoked. She does not have any smokeless tobacco history on file. She reports that she does not drink alcohol or use illicit drugs.  Family History: Family History  Problem Relation Age of Onset  . Uterine cancer Mother   . Diabetes Father   . Heart disease Father   . Diabetes Sister   . Stroke Sister   .  Diabetes Brother      Physical Exam: Filed Vitals:   09/07/12 0345 09/07/12 0415 09/07/12 0500 09/07/12 0536  BP: 130/61 132/65 145/73   Pulse: 89 92 106 101  Temp:      TempSrc:      Resp: 15 16 17    SpO2: 100% 100% 100%    Blood pressure 145/73, pulse 101, temperature 97.6 F (36.4 C), temperature source Oral, resp. rate 17, SpO2  100.00%.  GEN:  Pleasant  patient lying in the stretcher in no acute distress; cooperative with exam. PSYCH:  alert and oriented x4; does not appear anxious or depressed; affect is appropriate. HEENT: Mucous membranes pink and anicteric; PERRLA; EOM intact; no cervical lymphadenopathy nor thyromegaly or carotid bruit; no JVD; There were no stridor. Neck is very supple. Breasts:: Not examined CHEST WALL: No tenderness CHEST: Normal respiration, clear to auscultation bilaterally.  HEART: Regular rate and rhythm.  There are no murmur, rub, or gallops.   BACK: No kyphosis or scoliosis; no CVA tenderness ABDOMEN: soft and non-tender; no masses, no organomegaly, normal abdominal bowel sounds; no pannus; no intertriginous candida. There is no rebound and no distention. Rectal Exam: Not done EXTREMITIES: No bone or joint deformity; age-appropriate arthropathy of the hands and knees; no edema; no ulcerations.  There is no calf tenderness. Genitalia: not examined PULSES: 2+ and symmetric SKIN: Normal hydration no rash or ulceration CNS: Cranial nerves 2-12 grossly intact no focal lateralizing neurologic deficit.  Speech is fluent; uvula elevated with phonation, facial symmetry and tongue midline. DTR are normal bilaterally, cerebella exam is intact, barbinski is negative and strengths are equaled bilaterally.  No sensory loss.   Labs on Admission:  Basic Metabolic Panel:  Recent Labs Lab 09/07/12 0234  NA 139  K 4.0  CL 98  CO2 32  GLUCOSE 144*  BUN 17  CREATININE 0.68  CALCIUM 10.0   Liver Function Tests: No results found for this basename: AST, ALT, ALKPHOS, BILITOT, PROT, ALBUMIN,  in the last 168 hours No results found for this basename: LIPASE, AMYLASE,  in the last 168 hours No results found for this basename: AMMONIA,  in the last 168 hours CBC:  Recent Labs Lab 09/07/12 0234  WBC 15.5*  HGB 12.7  HCT 34.6*  MCV 87.8  PLT 261   Cardiac Enzymes: No results found for this  basename: CKTOTAL, CKMB, CKMBINDEX, TROPONINI,  in the last 168 hours  CBG: No results found for this basename: GLUCAP,  in the last 168 hours   Radiological Exams on Admission: Dg Chest 2 View  09/07/2012  *RADIOLOGY REPORT*  Clinical Data: Severe chest pain, shortness of breath and vomiting.  CHEST - 2 VIEW  Comparison: Chest radiograph performed 04/09/2010, and CTA of the chest performed 11/01/2006  Findings: The lungs are well-aerated.  Mild chronic lung changes are again seen.  There is no evidence of focal opacification, pleural effusion or pneumothorax.  The heart is normal in size; the mediastinal contour is within normal limits.  No acute osseous abnormalities are seen.  IMPRESSION: No acute cardiopulmonary process seen.   Original Report Authenticated By: Tonia Ghent, M.D.     EKG: Independently reviewed. Normal.  Assessment/Plan Present on Admission:  . Hypertension . Elevated cholesterol . GERD (gastroesophageal reflux disease) Atypical chest pain.  PLAN:  Will admit her for chest pain.  I would think that with severe chest pain, she would have some EKG changes if it were ACS.  I suppose if it is in  the circumflex artery, it may not show up.  She never had any exertional chest pain so I don't think she has a critical lesion.  But, it is possible.   The other causes are esophageal spasm or even printzmetal angina.  In any event, I will admit her for r/out.  Will change Zetia to Lipitor. I will add NTP to her chest wall.   Her BP is a little high, and I will add Lopressor 25mg  bid along with ASA to her regimen.  I recommend she stop her estrogen supplement.  Will obtain an ECHO.  I will admit her to Dr Clarene Reamer service as per prior arrangement, and thank you for allowing me to partake in the care of your nice patient.  Other plans as per orders.  Code Status: FULL Unk Lightning, MD. [facility] Pager [phone] 7pm to 7am.  09/07/2012, 5:54 AM

## 2012-09-07 NOTE — Consult Note (Signed)
Admit date: 09/07/2012 Referring Physician  Dr. Earl Gala Primary Physician  Dr. Earl Gala Primary Cardiologist  Eldridge Dace Reason for Consultation  Chest pain  HPI: 72 year old woman who has several risk factors for heart disease.  She had a normal stress test back in 2011.  She woke up this morning with discomfort in her upper chest.  It was worse when she tried to turn.  It is been worse with deep breaths.  She has noticed some pain in her neck over the last couple of days as well.  She has chronic low back pain and sees a [facility].  She had been receiving some laser treatments.  There is no shortness of breath or sweating associated with the chest pain.  After coming to the emergency room, she did receive some pain medication.  After the pain medicine, she became nauseated.  She had several dry heaves.  Now, she is chest pain-free.  She has only some mild discomfort with deep breathing.  Her activity is limited most by her back pain on a regular basis.  She has not noticed any exertional chest discomfort of late.      PMH:   Past Medical History  Diagnosis Date  . Elevated cholesterol   . Hypertension   . GERD (gastroesophageal reflux disease)   . Chest pain at rest 09/06/2012  . Arthritis     MILD IN HANDS "     PSH:   Past Surgical History  Procedure Laterality Date  . Vaginal hysterectomy      Allergies:  Codeine Prior to Admit Meds:   Prescriptions prior to admission  Medication Sig Dispense Refill  . Calcium Carbonate-Vitamin D (CALCIUM + D PO) Take by mouth 2 (two) times daily.        Marland Kitchen ezetimibe (ZETIA) 10 MG tablet Take 10 mg by mouth daily.      . hydrochlorothiazide (HYDRODIURIL) 25 MG tablet Take 25 mg by mouth daily. Take 1/2 pill      . loratadine (CLARITIN) 10 MG tablet Take 10 mg by mouth daily.      . Multiple Vitamin (MULTIVITAMIN) tablet Take 1 tablet by mouth daily.       . Estradiol Acetate (FEMRING) 0.05 MG/24HR RING One every three months  0.05 each  4   Fam  HX:    Family History  Problem Relation Age of Onset  . Uterine cancer Mother   . Diabetes Father   . Heart disease Father   . Diabetes Sister   . Stroke Sister   . Diabetes Brother    Social HX:    History   Social History  . Marital Status: Married    Spouse Name: N/A    Number of Children: N/A  . Years of Education: N/A   Occupational History  . Not on file.   Social History Main Topics  . Smoking status: Never Smoker   . Smokeless tobacco: Never Used  . Alcohol Use: No  . Drug Use: No  . Sexually Active: Yes    Birth Control/ Protection: Surgical   Other Topics Concern  . Not on file   Social History Narrative  . No narrative on file     ROS:  All 11 ROS were addressed and are negative except what is stated in the HPI  Physical Exam: Blood pressure 110/66, pulse 93, temperature 97.6 F (36.4 C), temperature source Oral, resp. rate 12, SpO2 100.00%.   General: Well developed, well nourished, in no acute distress Head:  Normal cephalic and atramatic  Lungs:   Clear bilaterally to auscultation and percussion. Heart:   HRRR S1 S2  Abdomen:  abdomen soft and non-tender  Msk:  Back normal, normal gait. Normal strength and tone for age. Extremities:   No  edema.   Neuro: Alert and oriented X 3. Psych: Normal affect, responds appropriately    Labs:   Lab Results  Component Value Date   WBC 14.1* 09/07/2012   HGB 11.5* 09/07/2012   HCT 31.5* 09/07/2012   MCV 88.0 09/07/2012   PLT 219 09/07/2012    Recent Labs Lab 09/07/12 0234 09/07/12 0842  NA 139  --   K 4.0  --   CL 98  --   CO2 32  --   BUN 17  --   CREATININE 0.68 0.60  CALCIUM 10.0  --   GLUCOSE 144*  --    No results found for this basename: PTT   Lab Results  Component Value Date   INR 0.83 04/09/2010   Lab Results  Component Value Date   CKTOTAL 22 04/10/2010   CKMB 0.9 04/10/2010   TROPONINI <0.30 09/07/2012     Lab Results  Component Value Date   CHOL  Value: 196        ATP III  CLASSIFICATION:  <200     mg/dL   Desirable  409-811  mg/dL   Borderline High  >=914    mg/dL   High        78/08/9560   Lab Results  Component Value Date   HDL 57 04/10/2010   Lab Results  Component Value Date   LDLCALC  Value: UNABLE TO CALCULATE IF TRIGLYCERIDE OVER 400 mg/dL        Total Cholesterol/HDL:CHD Risk Coronary Heart Disease Risk Table                     Men   Women  1/2 Average Risk   3.4   3.3  Average Risk       5.0   4.4  2 X Average Risk   9.6   7.1  3 X Average Risk  23.4   11.0        Use the calculated Patient Ratio above and the CHD Risk Table to determine the patient's CHD Risk.        ATP III CLASSIFICATION (LDL):  <100     mg/dL   Optimal  130-865  mg/dL   Near or Above                    Optimal  130-159  mg/dL   Borderline  784-696  mg/dL   High  >295     mg/dL   Very High 28/10/1322   Lab Results  Component Value Date   TRIG 434* 04/10/2010   Lab Results  Component Value Date   CHOLHDL 3.4 04/10/2010   No results found for this basename: LDLDIRECT      Radiology:  Dg Chest 2 View  09/07/2012  *RADIOLOGY REPORT*  Clinical Data: Severe chest pain, shortness of breath and vomiting.  CHEST - 2 VIEW  Comparison: Chest radiograph performed 04/09/2010, and CTA of the chest performed 11/01/2006  Findings: The lungs are well-aerated.  Mild chronic lung changes are again seen.  There is no evidence of focal opacification, pleural effusion or pneumothorax.  The heart is normal in size; the mediastinal contour is within normal limits.  No acute osseous  abnormalities are seen.  IMPRESSION: No acute cardiopulmonary process seen.   Original Report Authenticated By: Tonia Ghent, M.D.     EKG:  Sinus tachycardia, no significant ST segment changes  ASSESSMENT: Chest discomfort at rest.  Several atypical features.  PLAN:  We discussed the options of repeat stress test versus cardiac catheterization.  By history, her pain sounds more musculoskeletal.  If she continues to rule out  and shows no objective signs of ischemia, I think outpatient stress testing would be reasonable.  I have restarted clear liquid diet to see if she tolerates this.  If her rule out is complete, she potentially could go home later today and followup later this week in the office for stress testing.  Certainly, if she rules in, we will plan for cardiac catheterization.  Discussed with Dr. Earl Gala as well.    Stress test tentatively planned for tomorrow in our office at 12 noon.  If she is discharged in AM, could potentially leave IV in place and send her to our office.  Our office will call her room to give her instructions.   Corky Crafts., MD  09/07/2012  11:42 AM

## 2012-09-07 NOTE — ED Notes (Signed)
MD at bedside. 

## 2012-09-07 NOTE — ED Provider Notes (Signed)
History     CSN: [id number]  Arrival date & time 09/07/12  0219   First MD Initiated Contact with Patient 09/07/12 0320      Chief Complaint  Patient presents with  . Chest Pain    (Consider location/radiation/quality/duration/timing/severity/associated sxs/prior treatment) HPI Comments: Patient woke from sleep at 1 AM with the sudden onset of chest discomfort that she says initially radiated into both arms.  She felt short of breath but denies fevers or cough.  There is no nausea or diaphoresis.  She has a history of HTN, cholesterol, and family history but no DM and does not smoke.  She denies any recent exertional symptoms.  She took one full strength aspirin at home prior to coming here.  Was given ntg sublingual by ems and the symptoms improved, then returned shortly thereafter.  Stress test in the past many years ago that was okay.  Never had a heart cath.  Patient is a 72 y.o. female presenting with chest pain. The history is provided by the patient.  Chest Pain Pain location:  Substernal area Pain quality: pressure   Pain radiates to:  L arm and R arm Pain radiates to the back: no   Pain severity:  Moderate Onset quality:  Sudden Timing:  Constant Progression:  Unchanged Chronicity:  New Context: not breathing, no movement, no stress and no trauma   Relieved by:  Nothing Worsened by:  Nothing tried   Past Medical History  Diagnosis Date  . Elevated cholesterol   . Hypertension   . GERD (gastroesophageal reflux disease)     Past Surgical History  Procedure Laterality Date  . Vaginal hysterectomy      Family History  Problem Relation Age of Onset  . Uterine cancer Mother   . Diabetes Father   . Heart disease Father   . Diabetes Sister   . Stroke Sister   . Diabetes Brother     History  Substance Use Topics  . Smoking status: Never Smoker   . Smokeless tobacco: Not on file  . Alcohol Use: No    OB History   Grav Para Term Preterm Abortions TAB SAB  Ect Mult Living   3 2 2  1  1   2       Review of Systems  Cardiovascular: Positive for chest pain.  All other systems reviewed and are negative.    Allergies  Codeine  Home Medications   Current Outpatient Rx  Name  Route  Sig  Dispense  Refill  . Calcium Carbonate-Vitamin D (CALCIUM + D PO)   Oral   Take by mouth 2 (two) times daily.           . Estradiol Acetate (FEMRING) 0.05 MG/24HR RING      One every three months   0.05 each   4   . Ezetimibe (ZETIA PO)   Oral   Take by mouth.           . hydrochlorothiazide (HYDRODIURIL) 25 MG tablet   Oral   Take 25 mg by mouth daily. Take 1/2 pill          . Multiple Vitamin (MULTIVITAMIN) tablet   Oral   Take 1 tablet by mouth daily.             BP 118/54  Pulse 91  Temp(Src) 97.6 F (36.4 C) (Oral)  Resp 17  SpO2 100%  Physical Exam  Nursing note and vitals reviewed. Constitutional: She is oriented to person,  place, and time. She appears well-developed and well-nourished. No distress.  HENT:  Head: Normocephalic and atraumatic.  Neck: Normal range of motion. Neck supple.  Cardiovascular: Normal rate and regular rhythm.  Exam reveals no gallop and no friction rub.   No murmur heard. Pulmonary/Chest: Effort normal and breath sounds normal. No respiratory distress. She has no wheezes.  Abdominal: Soft. Bowel sounds are normal. She exhibits no distension. There is no tenderness.  Musculoskeletal: Normal range of motion.  Neurological: She is alert and oriented to person, place, and time.  Skin: Skin is warm and dry. She is not diaphoretic.    ED Course  Procedures (including critical care time)  Labs Reviewed  BASIC METABOLIC PANEL - Abnormal; Notable for the following:    Glucose, Bld 144 (*)    GFR calc non Af Amer 86 (*)    All other components within normal limits  CBC  POCT I-STAT TROPONIN I   Dg Chest 2 View  09/07/2012  *RADIOLOGY REPORT*  Clinical Data: Severe chest pain, shortness of  breath and vomiting.  CHEST - 2 VIEW  Comparison: Chest radiograph performed 04/09/2010, and CTA of the chest performed 11/01/2006  Findings: The lungs are well-aerated.  Mild chronic lung changes are again seen.  There is no evidence of focal opacification, pleural effusion or pneumothorax.  The heart is normal in size; the mediastinal contour is within normal limits.  No acute osseous abnormalities are seen.  IMPRESSION: No acute cardiopulmonary process seen.   Original Report Authenticated By: Tonia Ghent, M.D.      No diagnosis found.   Date: 09/07/2012  Rate: 89  Rhythm: normal sinus rhythm  QRS Axis: normal  Intervals: normal  ST/T Wave abnormalities: normal  Conduction Disutrbances:none  Narrative Interpretation:   Old EKG Reviewed: none available    MDM  The patient presents here with chest discomfort that started and woke her from sleep at 1 AM.  She got some relief with ntg in the ambulance but returned shortly thereafter.  The workup tonight is unremarkable, however her symptoms have not improved much with gi cocktail, ntg, and morphine.  I have spoken with Dr. Conley Rolls from medicine who requests I speak with cardiology as well.  Dr. Armanda Magic would like the patient to be admitted to medicine.  Dr. Conley Rolls aware and is in seeing the patient.  He will admit.        Geoffery Lyons, MD 09/07/12 [phone]

## 2012-09-07 NOTE — Progress Notes (Signed)
*  PRELIMINARY RESULTS* Echocardiogram 2D Echocardiogram has been performed.  Jeryl Columbia 09/07/2012, 3:16 PM

## 2012-09-07 NOTE — ED Notes (Signed)
Pt reports intermittent substernal chest pain that began approx 0100 that radiates to rt neck, pt took ASA at home prior to EMS arrival - pt has hx of reflux and has recently been treated for this, hx of pt's chest pain has been diagnosed w/ reflux. NSR, no diaphoresis or n/v initially. S/p administration of SL nitro by EMS pt experienced x1 episode of n/v, pt then given 4mg  zofran IV.

## 2012-09-08 MED ORDER — ASPIRIN 325 MG PO TBEC: 325.0000 mg | DELAYED_RELEASE_TABLET | Freq: Every day | ORAL | Status: DC

## 2012-09-08 MED ORDER — OMEPRAZOLE MAGNESIUM 20 MG PO TBEC: 20.0000 mg | DELAYED_RELEASE_TABLET | Freq: Every day | ORAL | Status: DC

## 2012-09-08 NOTE — Discharge Summary (Signed)
Physician Discharge Summary  NAME:Susan Gallegos [name]  ZOX:[id number]  DOB: [birth year]   Admit date: 09/07/2012 Discharge date: 09/08/2012  Admitting Diagnosis: chest pain  Discharge Diagnoses:  Active Hospital Problems   Diagnosis Date Noted  . Atypical chest pain 09/07/2012  . Elevated cholesterol   . GERD (gastroesophageal reflux disease) 05/25/2011  . Hypertension     Resolved Hospital Problems   Diagnosis Date Noted Date Resolved  No resolved problems to display.    Discharge Condition: improved  Hospital Course: Patient admitted for chest pain. On the basis of serial enzymes and EKG, a myocardial infarction was ruled out. She was seen by cardiology who thought she was at low risk and the she is discharged for a stress test later today.   In regard to etiology, she has had a lot of GERD symptoms lately. She recently started omeprazole and the heartburn was fading. I recommended that she continue that for now.   Consults: cardiology  Disposition: 01-Home or Self Care  Discharge Orders   Future Appointments Provider Department Dept Phone   04/19/2013 10:30 AM Harrington Challenger, NP [facility] [phone]   Future Orders Complete By Expires     Diet - low sodium heart healthy  As directed     Discharge instructions  As directed     Comments:      1. If the omeprazole does not resolve 100% of the reflux, let us know.  2. Report to [facility] at about 11:30 AM for a noon stress test. They will remove your saline lock after the test.    Increase activity slowly  As directed         Medication List    STOP taking these medications       Estradiol Acetate 0.05 MG/24HR Ring  Commonly known as:  FEMRING      TAKE these medications       aspirin 325 MG EC tablet  Take 1 tablet (325 mg total) by mouth daily.     CALCIUM + D PO  Take by mouth 2 (two) times daily.     ezetimibe 10 MG tablet  Commonly known as:  ZETIA  Take 10 mg by mouth daily.     hydrochlorothiazide 25 MG tablet  Commonly known as:  HYDRODIURIL  Take 25 mg by mouth daily. Take 1/2 pill     loratadine 10 MG tablet  Commonly known as:  CLARITIN  Take 10 mg by mouth daily.     multivitamin tablet  Take 1 tablet by mouth daily.     omeprazole 20 MG tablet  Commonly known as:  PRILOSEC OTC  Take 1 tablet (20 mg total) by mouth daily.         Things to follow up in the outpatient setting: stress test today  Time coordinating discharge: 25 minutes including medication reconciliation, transmission of prescriptions to pharmacy, preparation of discharge papers, and discussion with patient and family    Signed: Darnelle Bos 09/08/2012, 7:25 AM

## 2012-09-08 NOTE — Progress Notes (Signed)
Discharge papers, appointments, prescriptions, and followup stress test for today reviewed with patient and husband.  No questions or concerns.  Paperwork given to patient, IV left intact per MD order for stress test at [facility]. [address]

## 2013-03-30 ENCOUNTER — Other Ambulatory Visit: Payer: Self-pay

## 2013-03-30 DIAGNOSIS — Z1231 Encounter for screening mammogram for malignant neoplasm of breast: Secondary | ICD-10-CM

## 2013-04-19 ENCOUNTER — Encounter: Payer: Self-pay | Admitting: Women's Health

## 2013-04-19 ENCOUNTER — Ambulatory Visit (INDEPENDENT_AMBULATORY_CARE_PROVIDER_SITE_OTHER): Payer: BC Managed Care – PPO | Admitting: Women's Health

## 2013-04-19 VITALS — BP 140/70 | Ht 61.25 in | Wt 111.0 lb

## 2013-04-19 DIAGNOSIS — Z01419 Encounter for gynecological examination (general) (routine) without abnormal findings: Secondary | ICD-10-CM

## 2013-04-19 DIAGNOSIS — Z23 Encounter for immunization: Secondary | ICD-10-CM

## 2013-04-19 DIAGNOSIS — Z79890 Hormone replacement therapy (postmenopausal): Secondary | ICD-10-CM

## 2013-04-19 MED ORDER — ESTRADIOL 0.05 MG/24HR TD PTWK: 1.0000 | MEDICATED_PATCH | TRANSDERMAL | Status: DC

## 2013-04-19 NOTE — Patient Instructions (Signed)
pnuemonia vaccine and shingles/zostavac vaccine Vit d 2000 daily  Health Recommendations for Postmenopausal Women Respected and ongoing research has looked at the most common causes of death, disability, and poor quality of life in postmenopausal women. The causes include heart disease, diseases of blood vessels, diabetes, depression, cancer, and bone loss (osteoporosis). Many things can be done to help lower the chances of developing these and other common problems: CARDIOVASCULAR DISEASE Heart Disease: A heart attack is a medical emergency. Know the signs and symptoms of a heart attack. Below are things women can do to reduce their risk for heart disease.   Do not smoke. If you smoke, quit.  Aim for a healthy weight. Being overweight causes many preventable deaths. Eat a healthy and balanced diet and drink an adequate amount of liquids.  Get moving. Make a commitment to be more physically active. Aim for 30 minutes of activity on most, if not all days of the week.  Eat for heart health. Choose a diet that is low in saturated fat and cholesterol and eliminate trans fat. Include whole grains, vegetables, and fruits. Read and understand the labels on food containers before buying.  Know your numbers. Ask your caregiver to check your blood pressure, cholesterol (total, HDL, LDL, triglycerides) and blood glucose. Work with your caregiver on improving your entire clinical picture.  High blood pressure. Limit or stop your table salt intake (try salt substitute and food seasonings). Avoid salty foods and drinks. Read labels on food containers before buying. Eating well and exercising can help control high blood pressure. STROKE  Stroke is a medical emergency. Stroke may be the result of a blood clot in a blood vessel in the brain or by a brain hemorrhage (bleeding). Know the signs and symptoms of a stroke. To lower the risk of developing a stroke:  Avoid fatty foods.  Quit smoking.  Control your  diabetes, blood pressure, and irregular heart rate. THROMBOPHLEBITIS (BLOOD CLOT) OF THE LEG  Becoming overweight and leading a stationary lifestyle may also contribute to developing blood clots. Controlling your diet and exercising will help lower the risk of developing blood clots. CANCER SCREENING  Breast Cancer: Take steps to reduce your risk of breast cancer.  You should practice "breast self-awareness." This means understanding the normal appearance and feel of your breasts and should include breast self-examination. Any changes detected, no matter how small, should be reported to your caregiver.  After age 71, you should have a clinical breast exam (CBE) every year.  Starting at age 22, you should consider having a mammogram (breast X-ray) every year.  If you have a family history of breast cancer, talk to your caregiver about genetic screening.  If you are at high risk for breast cancer, talk to your caregiver about having an MRI and a mammogram every year.  Intestinal or Stomach Cancer: Tests to consider are a rectal exam, fecal occult blood, sigmoidoscopy, and colonoscopy. Women who are high risk may need to be screened at an earlier age and more often.  Cervical Cancer:  Beginning at age 46, you should have a Pap test every 3 years as long as the past 3 Pap tests have been normal.  If you have had past treatment for cervical cancer or a condition that could lead to cancer, you need Pap tests and screening for cancer for at least 20 years after your treatment.  If you had a hysterectomy for a problem that was not cancer or a condition that could lead  to cancer, then you no longer need Pap tests.  If you are between ages 100 and 20, and you have had normal Pap tests going back 10 years, you no longer need Pap tests.  If Pap tests have been discontinued, risk factors (such as a new sexual partner) need to be reassessed to determine if screening should be resumed.  Some medical  problems can increase the chance of getting cervical cancer. In these cases, your caregiver may recommend more frequent screening and Pap tests.  Uterine Cancer: If you have vaginal bleeding after reaching menopause, you should notify your caregiver.  Ovarian cancer: Other than yearly pelvic exams, there are no reliable tests available to screen for ovarian cancer at this time except for yearly pelvic exams.  Lung Cancer: Yearly chest X-rays can detect lung cancer and should be done on high risk women, such as cigarette smokers and women with chronic lung disease (emphysema).  Skin Cancer: A complete body skin exam should be done at your yearly examination. Avoid overexposure to the sun and ultraviolet light lamps. Use a strong sun block cream when in the sun. All of these things are important in lowering the risk of skin cancer. MENOPAUSE Menopause Symptoms: Hormone therapy products are effective for treating symptoms associated with menopause:  Moderate to severe hot flashes.  Night sweats.  Mood swings.  Headaches.  Tiredness.  Loss of sex drive.  Insomnia.  Other symptoms. Hormone replacement carries certain risks, especially in older women. Women who use or are thinking about using estrogen or estrogen with progestin treatments should discuss that with their caregiver. Your caregiver will help you understand the benefits and risks. The ideal dose of hormone replacement therapy is not known. The Food and Drug Administration (FDA) has concluded that hormone therapy should be used only at the lowest doses and for the shortest amount of time to reach treatment goals.  OSTEOPOROSIS Protecting Against Bone Loss and Preventing Fracture: If you use hormone therapy for prevention of bone loss (osteoporosis), the risks for bone loss must outweigh the risk of the therapy. Ask your caregiver about other medications known to be safe and effective for preventing bone loss and fractures. To guard  against bone loss or fractures, the following is recommended:  If you are less than age 51, take 1000 mg of calcium and at least 600 mg of Vitamin D per day.  If you are greater than age 22 but less than age 54, take 1200 mg of calcium and at least 600 mg of Vitamin D per day.  If you are greater than age 71, take 1200 mg of calcium and at least 800 mg of Vitamin D per day. Smoking and excessive alcohol intake increases the risk of osteoporosis. Eat foods rich in calcium and vitamin D and do weight bearing exercises several times a week as your caregiver suggests. DIABETES Diabetes Melitus: If you have Type I or Type 2 diabetes, you should keep your blood sugar under control with diet, exercise and recommended medication. Avoid too many sweets, starchy and fatty foods. Being overweight can make control more difficult. COGNITION AND MEMORY Cognition and Memory: Menopausal hormone therapy is not recommended for the prevention of cognitive disorders such as Alzheimer's disease or memory loss.  DEPRESSION  Depression may occur at any age, but is common in elderly women. The reasons may be because of physical, medical, social (loneliness), or financial problems and needs. If you are experiencing depression because of medical problems and control of  symptoms, talk to your caregiver about this. Physical activity and exercise may help with mood and sleep. Community and volunteer involvement may help your sense of value and worth. If you have depression and you feel that the problem is getting worse or becoming severe, talk to your caregiver about treatment options that are best for you. ACCIDENTS  Accidents are common and can be serious in the elderly woman. Prepare your house to prevent accidents. Eliminate throw rugs, place hand bars in the bath, shower and toilet areas. Avoid wearing high heeled shoes or walking on wet, snowy, and icy areas. Limit or stop driving if you have vision or hearing problems, or  you feel you are unsteady with you movements and reflexes. HEPATITIS C Hepatitis C is a type of viral infection affecting the liver. It is spread mainly through contact with blood from an infected person. It can be treated, but if left untreated, it can lead to severe liver damage over years. Many people who are infected do not know that the virus is in their blood. If you are a "baby-boomer", it is recommended that you have one screening test for Hepatitis C. IMMUNIZATIONS  Several immunizations are important to consider having during your senior years, including:   Tetanus, diptheria, and pertussis booster shot.  Influenza every year before the flu season begins.  Pneumonia vaccine.  Shingles vaccine.  Others as indicated based on your specific needs. Talk to your caregiver about these. Document Released: 08/14/2005 Document Revised: 06/08/2012 Document Reviewed: 04/09/2008 [facility] Patient Information 2014 [place], [place].

## 2013-04-19 NOTE — Progress Notes (Signed)
Susan Gallegos 08/10/1940 [id number]    History:    The patient presents for annual exam.  TVH for DUB 1979 had been on Femring until chest pain at rest 09/2012 , evaluated at the hospital and was instructed to stop.  Experiencing 6-7 hot flashes at night, started on estradiol 0.5 mg po daily per Cardiologist. .  Would like to try patch. Not sexually active/ husband in remission for prostate cancer. Normal Pap and mammogram history. History of HTN/ controlled, GERD, hypercholesterolemia, mild arthritis in hands.  Lab work managed by PCP.  Normal mammograms, last mammo normal in 2013.  DEXA 2010/ stable osteopenia.   Has not had a colonoscopy declines. Thinks she has had both zostavac and Pneumovax will check with her primary care to be sure.   Past medical history, past surgical history, family history and social history were all reviewed and documented in the EPIC chart. 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  Exam:  Filed Vitals:   04/19/13 1057  BP: 140/70    General appearance:  Normal, slim Head/Neck:  Normal, without cervical or supraclavicular adenopathy. Thyroid:  Symmetrical, normal in size, without palpable masses or nodularity. Respiratory  Effort:  Normal  Auscultation:  Clear without wheezing or rhonchi Cardiovascular  Auscultation:  Regular rate, without rubs, murmurs or gallops  Edema/varicosities:  Not grossly evident Abdominal  Soft,nontender, without masses, guarding or rebound.  Liver/spleen:  No organomegaly noted  Hernia:  None appreciated  Skin  Inspection:  Grossly normal  Palpation:  Grossly normal Neurologic/psychiatric  Orientation:  Normal with appropriate conversation.  Mood/affect:  Normal  Genitourinary     Breasts: Examined lying and sitting.     Right: Without masses, retractions, discharge or axillary adenopathy.     Left: Without masses, retractions, discharge or axillary adenopathy.   Inguinal/mons:  Normal without inguinal adenopathy  External genitalia:  Normal  BUS/Urethra/Skene's glands:  Normal  Bladder:  Normal  Vagina:  Atrophied, light pink  Cervix:  Absent - TVH  Uterus:  Absent  Adnexa/parametria:     Rt: Without masses or tenderness.   Lt: Without masses or tenderness.  Anus and perineum: Normal  Digital rectal exam: Normal sphincter tone without palpated masses or tenderness  Assessment/Plan:  72 y.o.  G3P2 MWF for annual exam.     TVH for DUB on HRT Osteopenia stable on DEXA 2010 HTN/hypercholesterolemia/GERD - primary care labs and meds  Plan:  HRT options reviewed, will try  Estradiol 0.5 mg patch,  instructed to change patch / week. Prescription, proper use, risks of blood clots, strokes, breast cancer reviewed.  Continue SBEs, annual mammogram, heart-healthy lifestyle.  .  2010 DEXA stable osteopenia/ schedule DEXA scan.  Flu vaccine administered. Home safety, fall prevention, vitamin D 2000 daily encouraged.  Harrington Challenger [facility], 11:35 AM 04/19/2013

## 2013-05-03 ENCOUNTER — Other Ambulatory Visit: Payer: Self-pay | Admitting: Gynecology

## 2013-05-03 DIAGNOSIS — M8580 Other specified disorders of bone density and structure, unspecified site: Secondary | ICD-10-CM

## 2013-05-08 ENCOUNTER — Ambulatory Visit: Payer: Medicare Other

## 2013-06-08 ENCOUNTER — Ambulatory Visit (INDEPENDENT_AMBULATORY_CARE_PROVIDER_SITE_OTHER): Payer: BC Managed Care – PPO

## 2013-06-08 DIAGNOSIS — M8580 Other specified disorders of bone density and structure, unspecified site: Secondary | ICD-10-CM

## 2013-06-08 DIAGNOSIS — M899 Disorder of bone, unspecified: Secondary | ICD-10-CM

## 2013-06-09 ENCOUNTER — Encounter: Payer: Self-pay | Admitting: Gynecology

## 2014-01-01 ENCOUNTER — Other Ambulatory Visit: Payer: Self-pay

## 2014-01-01 DIAGNOSIS — Z1231 Encounter for screening mammogram for malignant neoplasm of breast: Secondary | ICD-10-CM

## 2014-04-18 ENCOUNTER — Other Ambulatory Visit: Payer: Self-pay | Admitting: Women's Health

## 2014-04-18 ENCOUNTER — Other Ambulatory Visit: Payer: Self-pay

## 2014-04-18 ENCOUNTER — Ambulatory Visit
Admission: RE | Admit: 2014-04-18 | Discharge: 2014-04-18 | Disposition: A | Discharge status: Home / Self Care | Payer: BC Managed Care – PPO | Source: Ambulatory Visit

## 2014-04-18 DIAGNOSIS — Z1231 Encounter for screening mammogram for malignant neoplasm of breast: Secondary | ICD-10-CM

## 2014-04-25 ENCOUNTER — Encounter: Payer: Self-pay | Admitting: Women's Health

## 2014-04-25 ENCOUNTER — Ambulatory Visit (INDEPENDENT_AMBULATORY_CARE_PROVIDER_SITE_OTHER): Payer: BC Managed Care – PPO | Admitting: Women's Health

## 2014-04-25 VITALS — BP 140/80 | Ht 62.0 in | Wt 109.0 lb

## 2014-04-25 DIAGNOSIS — Z79890 Hormone replacement therapy (postmenopausal): Secondary | ICD-10-CM

## 2014-04-25 MED ORDER — ESTRADIOL 0.0375 MG/24HR TD PTWK: 0.0375 mg | MEDICATED_PATCH | TRANSDERMAL | Status: DC

## 2014-04-25 NOTE — Patient Instructions (Signed)
Health Recommendations for Postmenopausal Women Respected and ongoing research has looked at the most common causes of death, disability, and poor quality of life in postmenopausal women. The causes include heart disease, diseases of blood vessels, diabetes, depression, cancer, and bone loss (osteoporosis). Many things can be done to help lower the chances of developing these and other common problems. CARDIOVASCULAR DISEASE Heart Disease: A heart attack is a medical emergency. Know the signs and symptoms of a heart attack. Below are things women can do to reduce their risk for heart disease.   Do not smoke. If you smoke, quit.  Aim for a healthy weight. Being overweight causes many preventable deaths. Eat a healthy and balanced diet and drink an adequate amount of liquids.  Get moving. Make a commitment to be more physically active. Aim for 30 minutes of activity on most, if not all days of the week.  Eat for heart health. Choose a diet that is low in saturated fat and cholesterol and eliminate trans fat. Include whole grains, vegetables, and fruits. Read and understand the labels on food containers before buying.  Know your numbers. Ask your caregiver to check your blood pressure, cholesterol (total, HDL, LDL, triglycerides) and blood glucose. Work with your caregiver on improving your entire clinical picture.  High blood pressure. Limit or stop your table salt intake (try salt substitute and food seasonings). Avoid salty foods and drinks. Read labels on food containers before buying. Eating well and exercising can help control high blood pressure. STROKE  Stroke is a medical emergency. Stroke may be the result of a blood clot in a blood vessel in the brain or by a brain hemorrhage (bleeding). Know the signs and symptoms of a stroke. To lower the risk of developing a stroke:  Avoid fatty foods.  Quit smoking.  Control your diabetes, blood pressure, and irregular heart rate. THROMBOPHLEBITIS  (BLOOD CLOT) OF THE LEG  Becoming overweight and leading a stationary lifestyle may also contribute to developing blood clots. Controlling your diet and exercising will help lower the risk of developing blood clots. CANCER SCREENING  Breast Cancer: Take steps to reduce your risk of breast cancer.  You should practice "breast self-awareness." This means understanding the normal appearance and feel of your breasts and should include breast self-examination. Any changes detected, no matter how small, should be reported to your caregiver.  After age 40, you should have a clinical breast exam (CBE) every year.  Starting at age 40, you should consider having a mammogram (breast X-ray) every year.  If you have a family history of breast cancer, talk to your caregiver about genetic screening.  If you are at high risk for breast cancer, talk to your caregiver about having an MRI and a mammogram every year.  Intestinal or Stomach Cancer: Tests to consider are a rectal exam, fecal occult blood, sigmoidoscopy, and colonoscopy. Women who are high risk may need to be screened at an earlier age and more often.  Cervical Cancer:  Beginning at age 30, you should have a Pap test every 3 years as long as the past 3 Pap tests have been normal.  If you have had past treatment for cervical cancer or a condition that could lead to cancer, you need Pap tests and screening for cancer for at least 20 years after your treatment.  If you had a hysterectomy for a problem that was not cancer or a condition that could lead to cancer, then you no longer need Pap tests.    If you are between ages 65 and 70, and you have had normal Pap tests going back 10 years, you no longer need Pap tests.  If Pap tests have been discontinued, risk factors (such as a new sexual partner) need to be reassessed to determine if screening should be resumed.  Some medical problems can increase the chance of getting cervical cancer. In these  cases, your caregiver may recommend more frequent screening and Pap tests.  Uterine Cancer: If you have vaginal bleeding after reaching menopause, you should notify your caregiver.  Ovarian Cancer: Other than yearly pelvic exams, there are no reliable tests available to screen for ovarian cancer at this time except for yearly pelvic exams.  Lung Cancer: Yearly chest X-rays can detect lung cancer and should be done on high risk women, such as cigarette smokers and women with chronic lung disease (emphysema).  Skin Cancer: A complete body skin exam should be done at your yearly examination. Avoid overexposure to the sun and ultraviolet light lamps. Use a strong sun block cream when in the sun. All of these things are important for lowering the risk of skin cancer. MENOPAUSE Menopause Symptoms: Hormone therapy products are effective for treating symptoms associated with menopause:  Moderate to severe hot flashes.  Night sweats.  Mood swings.  Headaches.  Tiredness.  Loss of sex drive.  Insomnia.  Other symptoms. Hormone replacement carries certain risks, especially in older women. Women who use or are thinking about using estrogen or estrogen with progestin treatments should discuss that with their caregiver. Your caregiver will help you understand the benefits and risks. The ideal dose of hormone replacement therapy is not known. The Food and Drug Administration (FDA) has concluded that hormone therapy should be used only at the lowest doses and for the shortest amount of time to reach treatment goals.  OSTEOPOROSIS Protecting Against Bone Loss and Preventing Fracture If you use hormone therapy for prevention of bone loss (osteoporosis), the risks for bone loss must outweigh the risk of the therapy. Ask your caregiver about other medications known to be safe and effective for preventing bone loss and fractures. To guard against bone loss or fractures, the following is recommended:  If  you are younger than age 50, take 1000 mg of calcium and at least 600 mg of Vitamin D per day.  If you are older than age 50 but younger than age 70, take 1200 mg of calcium and at least 600 mg of Vitamin D per day.  If you are older than age 70, take 1200 mg of calcium and at least 800 mg of Vitamin D per day. Smoking and excessive alcohol intake increases the risk of osteoporosis. Eat foods rich in calcium and vitamin D and do weight bearing exercises several times a week as your caregiver suggests. DIABETES Diabetes Mellitus: If you have type I or type 2 diabetes, you should keep your blood sugar under control with diet, exercise, and recommended medication. Avoid starchy and fatty foods, and too many sweets. Being overweight can make diabetes control more difficult. COGNITION AND MEMORY Cognition and Memory: Menopausal hormone therapy is not recommended for the prevention of cognitive disorders such as Alzheimer's disease or memory loss.  DEPRESSION  Depression may occur at any age, but it is common in elderly women. This may be because of physical, medical, social (loneliness), or financial problems and needs. If you are experiencing depression because of medical problems and control of symptoms, talk to your caregiver about this. Physical   activity and exercise may help with mood and sleep. Community and volunteer involvement may improve your sense of value and worth. If you have depression and you feel that the problem is getting worse or becoming severe, talk to your caregiver about which treatment options are best for you. ACCIDENTS  Accidents are common and can be serious in elderly woman. Prepare your house to prevent accidents. Eliminate throw rugs, place hand bars in bath, shower, and toilet areas. Avoid wearing high heeled shoes or walking on wet, snowy, and icy areas. Limit or stop driving if you have vision or hearing problems, or if you feel you are unsteady with your movements and  reflexes. HEPATITIS C Hepatitis C is a type of viral infection affecting the liver. It is spread mainly through contact with blood from an infected person. It can be treated, but if left untreated, it can lead to severe liver damage over the years. Many people who are infected do not know that the virus is in their blood. If you are a "baby-boomer", it is recommended that you have one screening test for Hepatitis C. IMMUNIZATIONS  Several immunizations are important to consider having during your senior years, including:   Tetanus, diphtheria, and pertussis booster shot.  Influenza every year before the flu season begins.  Pneumonia vaccine.  Shingles vaccine.  Others, as indicated based on your specific needs. Talk to your caregiver about these. Document Released: 08/14/2005 Document Revised: 11/06/2013 Document Reviewed: 04/09/2008 Exit[employer] Patient Information 2015 [employer]. This information is not intended to replace advice given to you by your health care provider. Make sure you discuss any questions you have with your health care provider.  

## 2014-04-25 NOTE — Progress Notes (Signed)
Susan LatinRuth D Gallegos [birth year] [id number]    History:    Presents for annual exam.  1979 TVH for dysfunctional uterine bleeding. Used Femring until 09/2012, was having increased hot flushes currently on estradiol patch 0.05 with minimal hot flushes. Normal Pap and mammogram history. Osteopenia 06/2013 T score -1.1 right femoral neck, FRAX 13%/2.5%. Declines colonoscopy. Current on vaccinations. Primary care manages hypertension, GERD, hypercholesteremia.  Past medical history, past surgical history, family history and social history were all reviewed and documented in the EPIC chart. 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 Mother, father, sister, brother diabetes.  ROS:  A  12 point ROS was performed and pertinent positives and negatives are included.  Exam:  Filed Vitals:   04/25/14 1043  BP: 140/80    General appearance:  Normal Thyroid:  Symmetrical, normal in size, without palpable masses or nodularity. Respiratory  Auscultation:  Clear without wheezing or rhonchi Cardiovascular  Auscultation:  Regular rate, without rubs, murmurs or gallops  Edema/varicosities:  Not grossly evident Abdominal  Soft,nontender, without masses, guarding or rebound.  Liver/spleen:  No organomegaly noted  Hernia:  None appreciated  Skin  Inspection:  Grossly normal   Breasts: Examined lying and sitting.     Right: Without masses, retractions, discharge or axillary adenopathy.     Left: Without masses, retractions, discharge or axillary adenopathy. Gentitourinary   Inguinal/mons:  Normal without inguinal adenopathy  External genitalia:  Normal  BUS/Urethra/Skene's glands:  Normal  Vagina:  Normal  Cervix:  Absent Uterus:  Absent  Adnexa/parametria:     Rt: Without masses or tenderness.   Lt: Without masses or tenderness.  Anus and perineum: Normal  Digital rectal exam: Normal sphincter tone without palpated masses or tenderness  Assessment/Plan:  73 y.o. MWF G2P2 for annual exam with no  complaints.  TVH on HRT Osteopenia without elevated FRAX  Hypertension/GERD/hypercholesteremia to primary care manages labs and meds  Plan: HRT reviewed risks of blood clots, strokes, breast cancer, will try estradiol 0.0375 patch weekly, instructed to call if continued problems, reviewed best to use lowest dose of estrogen for shortest amount of time. SBE's, continue annual mammogram, calcium rich diet, vitamin D 2000 daily encouraged. Home safety, fall prevention and importance of regular exercise reviewed.   Harrington ChallengerYOUNG,Salomon Ganser J WHNP, 1:48 PM 04/25/2014

## 2014-05-07 ENCOUNTER — Encounter: Payer: Self-pay | Admitting: Women's Health

## 2014-10-17 ENCOUNTER — Other Ambulatory Visit: Payer: Self-pay | Admitting: Dermatology

## 2015-04-18 ENCOUNTER — Ambulatory Visit: Payer: BC Managed Care – PPO

## 2015-04-19 ENCOUNTER — Ambulatory Visit
Admission: RE | Admit: 2015-04-19 | Discharge: 2015-04-19 | Disposition: A | Discharge status: Home / Self Care | Payer: BLUE CROSS/BLUE SHIELD | Source: Ambulatory Visit

## 2015-04-19 DIAGNOSIS — Z1231 Encounter for screening mammogram for malignant neoplasm of breast: Secondary | ICD-10-CM

## 2015-04-29 ENCOUNTER — Ambulatory Visit (INDEPENDENT_AMBULATORY_CARE_PROVIDER_SITE_OTHER): Payer: BLUE CROSS/BLUE SHIELD | Admitting: Women's Health

## 2015-04-29 ENCOUNTER — Encounter: Payer: Self-pay | Admitting: Women's Health

## 2015-04-29 VITALS — BP 134/78 | Ht 61.25 in | Wt 106.0 lb

## 2015-04-29 DIAGNOSIS — Z13820 Encounter for screening for osteoporosis: Secondary | ICD-10-CM

## 2015-04-29 DIAGNOSIS — M899 Disorder of bone, unspecified: Secondary | ICD-10-CM | POA: Diagnosis not present

## 2015-04-29 DIAGNOSIS — M8580 Other specified disorders of bone density and structure, unspecified site: Secondary | ICD-10-CM

## 2015-04-29 DIAGNOSIS — Z79890 Hormone replacement therapy (postmenopausal): Secondary | ICD-10-CM | POA: Diagnosis not present

## 2015-04-29 MED ORDER — ESTRADIOL 0.0375 MG/24HR TD PTWK: 0.0375 mg | MEDICATED_PATCH | TRANSDERMAL | Status: DC

## 2015-04-29 NOTE — Progress Notes (Signed)
Susan LatinRuth D Gallegos 08/26/1940 [id number]    History:    Presents for breast and pelvic exam exam. 1979 TVH for DUB, Estradiol 0.0375 patch with minimal hot flashes. Abstinent/husbands health.Normal Pap and mammogram history.  06/2013 T score -1.1 right femoral neck, FRAX 13%/2.5%. Declines colonoscopy. Current on vaccinations. Primary care manages hypertension, GERD, hypercholesteremia.  Past medical history, past surgical history, family history and social history were all reviewed and documented in the EPIC chart. 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 Spends lots of time with grandchildren.  ROS:  A ROS was performed and pertinent positives and negatives are included.  Exam:  Filed Vitals:   04/29/15 0932  BP: 134/78    General appearance:  Normal Thyroid:  Symmetrical, normal in size, without palpable masses or nodularity. Respiratory  Auscultation:  Clear without wheezing or rhonchi Cardiovascular  Auscultation:  Regular rate, without rubs, murmurs or gallops  Edema/varicosities:  Not grossly evident Abdominal  Soft,nontender, without masses, guarding or rebound.  Liver/spleen:  No organomegaly noted  Hernia:  None appreciated  Skin  Inspection:  Grossly normal   Breasts: Examined lying and sitting.     Right: Without masses, retractions, discharge or axillary adenopathy.     Left: Without masses, retractions, discharge or axillary adenopathy. Gentitourinary   Inguinal/mons:  Normal without inguinal adenopathy  External genitalia:  Normal  BUS/Urethra/Skene's glands:  Normal  Vagina:  Normal  Cervix and uterus removed  Adnexa/parametria:     Rt: Without masses or tenderness.   Lt: Without masses or tenderness.  Anus and perineum: Normal  Digital rectal exam:Normal sphincter tone without palpated masses or tenderness   Assessment/Plan:  74 y.o.  for breast and pelvic exam.  MWF G2P2  with no complaints.  4279 TVH for DUB on HRT Osteopenia without elevated FRAX    Hypertension/GERD/hypercholesteremia to primary care manages labs and meds  Plan: Continue Estradiol 0.0375 patch. Risk of blood clots, stroke, breast cancer reviewed, will try to decrease to 0.025 next year. SBE's, continue annual mammogram, calcium rich diet, vitamin D 2000 daily encouraged. Schedule bone density scan. Home safety, fall prevention and importance of regular exercise reviewed. Educated on virtual colonoscopy, discuss option with PCP. Follow up in one year for annual GYN exam.    Harrington ChallengerYOUNG,NANCY J [facility] CenterWHNP, 10:39 AM 04/29/2015

## 2015-04-29 NOTE — Patient Instructions (Signed)
Menopause is a normal process in which your reproductive ability comes to an end. This process happens gradually over a span of months to years, usually between the ages of 48 and 55. Menopause is complete when you have missed 12 consecutive menstrual periods. It is important to talk with your health care provider about some of the most common conditions that affect postmenopausal women, such as heart disease, cancer, and bone loss (osteoporosis). Adopting a healthy lifestyle and getting preventive care can help to promote your health and wellness. Those actions can also lower your chances of developing some of these common conditions. WHAT SHOULD I KNOW ABOUT MENOPAUSE? During menopause, you may experience a number of symptoms, such as:  Moderate-to-severe hot flashes.  Night sweats.  Decrease in sex drive.  Mood swings.  Headaches.  Tiredness.  Irritability.  Memory problems.  Insomnia. Choosing to treat or not to treat menopausal changes is an individual decision that you make with your health care provider. WHAT SHOULD I KNOW ABOUT HORMONE REPLACEMENT THERAPY AND SUPPLEMENTS? Hormone therapy products are effective for treating symptoms that are associated with menopause, such as hot flashes and night sweats. Hormone replacement carries certain risks, especially as you become older. If you are thinking about using estrogen or estrogen with progestin treatments, discuss the benefits and risks with your health care provider. WHAT SHOULD I KNOW ABOUT HEART DISEASE AND STROKE? Heart disease, heart attack, and stroke become more likely as you age. This may be due, in part, to the hormonal changes that your body experiences during menopause. These can affect how your body processes dietary fats, triglycerides, and cholesterol. Heart attack and stroke are both medical emergencies. There are many things that you can do to help prevent heart disease and stroke:  Have your blood pressure  checked at least every 1-2 years. High blood pressure causes heart disease and increases the risk of stroke.  If you are 55-79 years old, ask your health care provider if you should take aspirin to prevent a heart attack or a stroke.  Do not use any tobacco products, including cigarettes, chewing tobacco, or electronic cigarettes. If you need help quitting, ask your health care provider.  It is important to eat a healthy diet and maintain a healthy weight.  Be sure to include plenty of vegetables, fruits, low-fat dairy products, and lean protein.  Avoid eating foods that are high in solid fats, added sugars, or salt (sodium).  Get regular exercise. This is one of the most important things that you can do for your health.  Try to exercise for at least 150 minutes each week. The type of exercise that you do should increase your heart rate and make you sweat. This is known as moderate-intensity exercise.  Try to do strengthening exercises at least twice each week. Do these in addition to the moderate-intensity exercise.  Know your numbers.Ask your health care provider to check your cholesterol and your blood glucose. Continue to have your blood tested as directed by your health care provider. WHAT SHOULD I KNOW ABOUT CANCER SCREENING? There are several types of cancer. Take the following steps to reduce your risk and to catch any cancer development as early as possible. Breast Cancer  Practice breast self-awareness.  This means understanding how your breasts normally appear and feel.  It also means doing regular breast self-exams. Let your health care provider know about any changes, no matter how small.  If you are 40 or older, have a clinician do a   breast exam (clinical breast exam or CBE) every year. Depending on your age, family history, and medical history, it may be recommended that you also have a yearly breast X-ray (mammogram).  If you have a family history of breast cancer,  talk with your health care provider about genetic screening.  If you are at high risk for breast cancer, talk with your health care provider about having an MRI and a mammogram every year.  Breast cancer (BRCA) gene test is recommended for women who have family members with BRCA-related cancers. Results of the assessment will determine the need for genetic counseling and BRCA1 and for BRCA2 testing. BRCA-related cancers include these types:  Breast. This occurs in males or females.  Ovarian.  Tubal. This may also be called fallopian tube cancer.  Cancer of the abdominal or pelvic lining (peritoneal cancer).  Prostate.  Pancreatic. Cervical, Uterine, and Ovarian Cancer Your health care provider may recommend that you be screened regularly for cancer of the pelvic organs. These include your ovaries, uterus, and vagina. This screening involves a pelvic exam, which includes checking for microscopic changes to the surface of your cervix (Pap test).  For women ages 21-65, health care providers may recommend a pelvic exam and a Pap test every three years. For women ages 77-65, they may recommend the Pap test and pelvic exam, combined with testing for human papilloma virus (HPV), every five years. Some types of HPV increase your risk of cervical cancer. Testing for HPV may also be done on women of any age who have unclear Pap test results.  Other health care providers may not recommend any screening for nonpregnant women who are considered low risk for pelvic cancer and have no symptoms. Ask your health care provider if a screening pelvic exam is right for you.  If you have had past treatment for cervical cancer or a condition that could lead to cancer, you need Pap tests and screening for cancer for at least 20 years after your treatment. If Pap tests have been discontinued for you, your risk factors (such as having a new sexual partner) need to be reassessed to determine if you should start having  screenings again. Some women have medical problems that increase the chance of getting cervical cancer. In these cases, your health care provider may recommend that you have screening and Pap tests more often.  If you have a family history of uterine cancer or ovarian cancer, talk with your health care provider about genetic screening.  If you have vaginal bleeding after reaching menopause, tell your health care provider.  There are currently no reliable tests available to screen for ovarian cancer. Lung Cancer Lung cancer screening is recommended for adults 3-70 years old who are at high risk for lung cancer because of a history of smoking. A yearly low-dose CT scan of the lungs is recommended if you:  Currently smoke.  Have a history of at least 30 pack-years of smoking and you currently smoke or have quit within the past 15 years. A pack-year is smoking an average of one pack of cigarettes per day for one year. Yearly screening should:  Continue until it has been 15 years since you quit.  Stop if you develop a health problem that would prevent you from having lung cancer treatment. Colorectal Cancer  This type of cancer can be detected and can often be prevented.  Routine colorectal cancer screening usually begins at age 38 and continues through age 12.  If you have  risk factors for colon cancer, your health care provider may recommend that you be screened at an earlier age.  If you have a family history of colorectal cancer, talk with your health care provider about genetic screening.  Your health care provider may also recommend using home test kits to check for hidden blood in your stool.  A small camera at the end of a tube can be used to examine your colon directly (sigmoidoscopy or colonoscopy). This is done to check for the earliest forms of colorectal cancer.  Direct examination of the colon should be repeated every 5-10 years until age 67. However, if early forms of  precancerous polyps or small growths are found or if you have a family history or genetic risk for colorectal cancer, you may need to be screened more often. Skin Cancer  Check your skin from head to toe regularly.  Monitor any moles. Be sure to tell your health care provider:  About any new moles or changes in moles, especially if there is a change in a mole's shape or color.  If you have a mole that is larger than the size of a pencil eraser.  If any of your family members has a history of skin cancer, especially at a young age, talk with your health care provider about genetic screening.  Always use sunscreen. Apply sunscreen liberally and repeatedly throughout the day.  Whenever you are outside, protect yourself by wearing long sleeves, pants, a wide-brimmed hat, and sunglasses. WHAT SHOULD I KNOW ABOUT OSTEOPOROSIS? Osteoporosis is a condition in which bone destruction happens more quickly than new bone creation. After menopause, you may be at an increased risk for osteoporosis. To help prevent osteoporosis or the bone fractures that can happen because of osteoporosis, the following is recommended:  If you are 39-61 years old, get at least 1,000 mg of calcium and at least 600 mg of vitamin D per day.  If you are older than age 16 but younger than age 7, get at least 1,200 mg of calcium and at least 600 mg of vitamin D per day.  If you are older than age 47, get at least 1,200 mg of calcium and at least 800 mg of vitamin D per day. Smoking and excessive alcohol intake increase the risk of osteoporosis. Eat foods that are rich in calcium and vitamin D, and do weight-bearing exercises several times each week as directed by your health care provider. WHAT SHOULD I KNOW ABOUT HOW MENOPAUSE AFFECTS [place]? Depression may occur at any age, but it is more common as you become older. Common symptoms of depression include:  Low or sad mood.  Changes in sleep patterns.  Changes  in appetite or eating patterns.  Feeling an overall lack of motivation or enjoyment of activities that you previously enjoyed.  Frequent crying spells. Talk with your health care provider if you think that you are experiencing depression. WHAT SHOULD I KNOW ABOUT IMMUNIZATIONS? It is important that you get and maintain your immunizations. These include:  Tetanus, diphtheria, and pertussis (Tdap) booster vaccine.  Influenza every year before the flu season begins.  Pneumonia vaccine.  Shingles vaccine. Your health care provider may also recommend other immunizations.   This information is not intended to replace advice given to you by your health care provider. Make sure you discuss any questions you have with your health care provider.   Document Released: 08/14/2005 Document Revised: 07/13/2014 Document Reviewed: 02/22/2014 Elsevier Interactive Patient Education 2016 [employer] Bone Health Bones protect organs, store calcium, and anchor muscles. Good health habits, such as eating nutritious foods and exercising regularly, are important for maintaining healthy bones. They can also help to prevent a condition that causes bones to lose density and become weak and brittle (osteoporosis). WHY IS BONE MASS IMPORTANT? Bone mass refers to the amount of bone tissue that you have. The higher your bone mass, the stronger your bones. An important step toward having healthy bones throughout life is to have strong and dense bones during childhood. A young adult who has a high bone mass is more likely to have a high bone mass later in life. Bone mass at its greatest it is called peak bone mass. A large decline in bone mass occurs in older adults. In women, it occurs about the time of menopause. During this time, it is important to practice good health habits, because if more bone is lost than what is replaced, the bones will become less healthy and more likely to break (fracture). If you find that  you have a low bone mass, you may be able to prevent osteoporosis or further bone loss by changing your diet and lifestyle. HOW CAN I FIND OUT IF MY BONE MASS IS LOW? Bone mass can be measured with an X-ray test that is called a bone mineral density (BMD) test. This test is recommended for all women who are age 24 or older. It may also be recommended for men who are age 346 or older, or for people who are more likely to develop osteoporosis due to:  Having bones that break easily.  Having a long-term disease that weakens bones, such as kidney disease or rheumatoid arthritis.  Having menopause earlier than normal.  Taking medicine that weakens bones, such as steroids, thyroid hormones, or hormone treatment for breast cancer or prostate cancer.  Smoking.  Drinking three or more alcoholic drinks each day. WHAT ARE THE NUTRITIONAL RECOMMENDATIONS FOR HEALTHY BONES? To have healthy bones, you need to get enough of the right minerals and vitamins. Most nutrition experts recommend getting these nutrients from the foods that you eat. Nutritional recommendations vary from person to person. Ask your health care provider what is healthy for you. Here are some general guidelines. Calcium Recommendations Calcium is the most important (essential) mineral for bone health. Most people can get enough calcium from their diet, but supplements may be recommended for people who are at risk for osteoporosis. Good sources of calcium include:  Dairy products, such as low-fat or nonfat milk, cheese, and yogurt.  Dark green leafy vegetables, such as bok choy and broccoli.  Calcium-fortified foods, such as orange juice, cereal, bread, soy beverages, and tofu products.  Nuts, such as almonds. Follow these recommended amounts for daily calcium intake:  Children, age 34-3: 700 mg.  Children, age 44-8: 1,000 mg.  Children, age 65-13: 1,300 mg.  Teens, age 33-18: 1,300 mg.  Adults, age 66-50: 1,000 mg.  Adults,  age 96-70:  Men: 1,000 mg.  Women: 1,200 mg.  Adults, age 440 or older: 1,200 mg.  Pregnant and breastfeeding females:  Teens: 1,300 mg.  Adults: 1,000 mg. Vitamin D Recommendations Vitamin D is the most essential vitamin for bone health. It helps the body to absorb calcium. Sunlight stimulates the skin to make vitamin D, so be sure to get enough sunlight. If you live in a cold climate or you do not get outside often, your health care provider may recommend that you take vitamin D  supplements. Good sources of vitamin D in your diet include:  Egg yolks.  Saltwater fish.  Milk and cereal fortified with vitamin D. Follow these recommended amounts for daily vitamin D intake:  Children and teens, age 48-18: 600 international units.  Adults, age 43 or younger: 400-800 international units.  Adults, age 481 or older: 800-1,000 international units. Other Nutrients Other nutrients for bone health include:  Phosphorus. This mineral is found in meat, poultry, dairy foods, nuts, and legumes. The recommended daily intake for adult men and adult women is 700 mg.  Magnesium. This mineral is found in seeds, nuts, dark green vegetables, and legumes. The recommended daily intake for adult men is 400-420 mg. For adult women, it is 310-320 mg.  Vitamin K. This vitamin is found in green leafy vegetables. The recommended daily intake is 120 mg for adult men and 90 mg for adult women. WHAT TYPE OF PHYSICAL ACTIVITY IS BEST FOR BUILDING AND MAINTAINING HEALTHY BONES? Weight-bearing and strength-building activities are important for building and maintaining peak bone mass. Weight-bearing activities cause muscles and bones to work against gravity. Strength-building activities increases muscle strength that supports bones. Weight-bearing and muscle-building activities include:  Walking and hiking.  Jogging and running.  Dancing.  Gym exercises.  Lifting weights.  Tennis and racquetball.  Climbing  stairs.  Aerobics. Adults should get at least 30 minutes of moderate physical activity on most days. Children should get at least 60 minutes of moderate physical activity on most days. Ask your health care provide what type of exercise is best for you. WHERE CAN I FIND MORE INFORMATION? For more information, check out the following websites:  [place]: [employer]se  [employer]: http://www.niams.AnonymousEar.fr.asp   This information is not intended to replace advice given to you by your health care provider. Make sure you discuss any questions you have with your health care provider.   Document Released: 09/12/2003 Document Revised: 11/06/2014 Document Reviewed: 06/27/2014 Elsevier Interactive Patient Education [employer].

## 2015-05-03 ENCOUNTER — Encounter: Payer: BC Managed Care – PPO | Admitting: Women's Health

## 2015-08-31 ENCOUNTER — Emergency Department (HOSPITAL_COMMUNITY): Payer: BLUE CROSS/BLUE SHIELD

## 2015-08-31 ENCOUNTER — Encounter (HOSPITAL_COMMUNITY): Payer: Self-pay | Admitting: Emergency Medicine

## 2015-08-31 ENCOUNTER — Observation Stay (HOSPITAL_COMMUNITY)
Admission: EM | Admit: 2015-08-31 | Discharge: 2015-09-01 | Disposition: A | Discharge status: Home / Self Care | Payer: BLUE CROSS/BLUE SHIELD | Attending: Internal Medicine | Admitting: Internal Medicine

## 2015-08-31 DIAGNOSIS — Z79899 Other long term (current) drug therapy: Secondary | ICD-10-CM | POA: Diagnosis not present

## 2015-08-31 DIAGNOSIS — Z79890 Hormone replacement therapy (postmenopausal): Secondary | ICD-10-CM | POA: Insufficient documentation

## 2015-08-31 DIAGNOSIS — R079 Chest pain, unspecified: Secondary | ICD-10-CM | POA: Diagnosis present

## 2015-08-31 DIAGNOSIS — R0789 Other chest pain: Principal | ICD-10-CM | POA: Diagnosis present

## 2015-08-31 DIAGNOSIS — I2510 Atherosclerotic heart disease of native coronary artery without angina pectoris: Secondary | ICD-10-CM | POA: Diagnosis not present

## 2015-08-31 DIAGNOSIS — M19041 Primary osteoarthritis, right hand: Secondary | ICD-10-CM | POA: Diagnosis not present

## 2015-08-31 DIAGNOSIS — E7800 Pure hypercholesterolemia, unspecified: Secondary | ICD-10-CM | POA: Diagnosis not present

## 2015-08-31 DIAGNOSIS — K219 Gastro-esophageal reflux disease without esophagitis: Secondary | ICD-10-CM | POA: Diagnosis not present

## 2015-08-31 DIAGNOSIS — Z8249 Family history of ischemic heart disease and other diseases of the circulatory system: Secondary | ICD-10-CM | POA: Diagnosis not present

## 2015-08-31 DIAGNOSIS — R071 Chest pain on breathing: Secondary | ICD-10-CM | POA: Diagnosis not present

## 2015-08-31 DIAGNOSIS — I10 Essential (primary) hypertension: Secondary | ICD-10-CM | POA: Diagnosis not present

## 2015-08-31 DIAGNOSIS — M19042 Primary osteoarthritis, left hand: Secondary | ICD-10-CM | POA: Diagnosis not present

## 2015-08-31 LAB — TROPONIN I
Troponin I: <0.03 ng/mL (ref <0.031)
Troponin I: <0.03 ng/mL (ref <0.031)
Troponin I: <0.03 ng/mL (ref <0.031)

## 2015-08-31 LAB — I-STAT CHEM 8, ED
BUN: 13 mg/dL (ref 6–20)
Calcium, Ion: 1.05 mmol/L — ABNORMAL LOW (ref 1.13–1.30)
Chloride: 93 mmol/L — ABNORMAL LOW (ref 101–111)
Creatinine, Ser: 0.6 mg/dL (ref 0.44–1.00)
Glucose, Bld: 111 mg/dL — ABNORMAL HIGH (ref 65–99)
HCT: 43 % (ref 36.0–46.0)
Hemoglobin: 14.6 g/dL (ref 12.0–15.0)
Potassium: 3.4 mmol/L — ABNORMAL LOW (ref 3.5–5.1)
Sodium: 134 mmol/L — ABNORMAL LOW (ref 135–145)
TCO2: 29 mmol/L (ref 0–100)

## 2015-08-31 LAB — CBC
HCT: 38.5 % (ref 36.0–46.0)
HCT: 37.6 % (ref 36.0–46.0)
Hemoglobin: 13.4 g/dL (ref 12.0–15.0)
Hemoglobin: 13 g/dL (ref 12.0–15.0)
MCH: 31.3 pg (ref 26.0–34.0)
MCH: 31.3 pg (ref 26.0–34.0)
MCHC: 34.8 g/dL (ref 30.0–36.0)
MCHC: 34.6 g/dL (ref 30.0–36.0)
MCV: 90 fL (ref 78.0–100.0)
MCV: 90.4 fL (ref 78.0–100.0)
Platelets: 259 10*3/uL (ref 150–400)
Platelets: 250 10*3/uL (ref 150–400)
RBC: 4.28 MIL/uL (ref 3.87–5.11)
RBC: 4.16 MIL/uL (ref 3.87–5.11)
RDW: 12 % (ref 11.5–15.5)
RDW: 12.1 % (ref 11.5–15.5)
WBC: 5.9 10*3/uL (ref 4.0–10.5)
WBC: 6.4 10*3/uL (ref 4.0–10.5)

## 2015-08-31 LAB — BASIC METABOLIC PANEL
Anion gap: 13 (ref 5–15)
BUN: 11 mg/dL (ref 6–20)
CO2: 27 mmol/L (ref 22–32)
Calcium: 9.4 mg/dL (ref 8.9–10.3)
Chloride: 94 mmol/L — ABNORMAL LOW (ref 101–111)
Creatinine, Ser: 0.7 mg/dL (ref 0.44–1.00)
GFR calc Af Amer: >60 mL/min (ref >60)
GFR calc non Af Amer: >60 mL/min (ref >60)
Glucose, Bld: 117 mg/dL — ABNORMAL HIGH (ref 65–99)
Potassium: 3.4 mmol/L — ABNORMAL LOW (ref 3.5–5.1)
Sodium: 134 mmol/L — ABNORMAL LOW (ref 135–145)

## 2015-08-31 LAB — I-STAT TROPONIN, ED: Troponin i, poc: 0 ng/mL (ref 0.00–0.08)

## 2015-08-31 MED ORDER — ENOXAPARIN SODIUM 40 MG/0.4ML ~~LOC~~ SOLN
40.0000 mg | SUBCUTANEOUS | Status: DC
  Administered 2015-08-31: 40 mg via SUBCUTANEOUS
  Filled 2015-08-31: qty 0.4

## 2015-08-31 MED ORDER — IOHEXOL 350 MG/ML SOLN
100.0000 mL | Freq: Once | INTRAVENOUS | Status: AC | PRN
  Administered 2015-08-31: 100 mL via INTRAVENOUS

## 2015-08-31 MED ORDER — ASPIRIN 81 MG PO CHEW
324.0000 mg | CHEWABLE_TABLET | Freq: Once | ORAL | Status: AC
  Administered 2015-08-31: 324 mg via ORAL
  Filled 2015-08-31: qty 4

## 2015-08-31 MED ORDER — ESTRADIOL 0.0375 MG/24HR TD PTWK
0.0375 mg | MEDICATED_PATCH | TRANSDERMAL | Status: DC
  Administered 2015-09-01: 0.0375 mg via TRANSDERMAL
  Filled 2015-08-31: qty 1

## 2015-08-31 MED ORDER — GI COCKTAIL ~~LOC~~
30.0000 mL | Freq: Four times a day (QID) | ORAL | Status: DC | PRN
  Administered 2015-08-31 – 2015-09-01 (×3): 30 mL via ORAL
  Filled 2015-08-31 (×3): qty 30

## 2015-08-31 MED ORDER — ACETAMINOPHEN 325 MG PO TABS: 650.0000 mg | ORAL_TABLET | ORAL | Status: DC | PRN

## 2015-08-31 MED ORDER — POTASSIUM CHLORIDE CRYS ER 20 MEQ PO TBCR
60.0000 meq | EXTENDED_RELEASE_TABLET | Freq: Two times a day (BID) | ORAL | Status: DC
  Administered 2015-08-31: 60 meq via ORAL
  Filled 2015-08-31: qty 3

## 2015-08-31 MED ORDER — ASPIRIN 81 MG PO CHEW
324.0000 mg | CHEWABLE_TABLET | Freq: Once | ORAL | Status: DC
  Filled 2015-08-31: qty 4

## 2015-08-31 MED ORDER — PANTOPRAZOLE SODIUM 40 MG PO TBEC
40.0000 mg | DELAYED_RELEASE_TABLET | Freq: Every day | ORAL | Status: DC
  Administered 2015-09-01: 40 mg via ORAL
  Filled 2015-08-31: qty 1

## 2015-08-31 MED ORDER — ONDANSETRON HCL 4 MG/2ML IJ SOLN: 4.0000 mg | Freq: Four times a day (QID) | INTRAMUSCULAR | Status: DC | PRN

## 2015-08-31 MED ORDER — HYDROCHLOROTHIAZIDE 25 MG PO TABS: 12.5000 mg | ORAL_TABLET | Freq: Every day | ORAL | Status: DC

## 2015-08-31 MED ORDER — SODIUM CHLORIDE 0.9 % IV BOLUS (SEPSIS)
1000.0000 mL | Freq: Once | INTRAVENOUS | Status: AC
Start: 2015-08-31 — End: 2015-08-31
  Administered 2015-08-31: 1000 mL via INTRAVENOUS

## 2015-08-31 MED ORDER — EZETIMIBE 10 MG PO TABS
10.0000 mg | ORAL_TABLET | Freq: Every day | ORAL | Status: DC
  Administered 2015-08-31 – 2015-09-01 (×2): 10 mg via ORAL
  Filled 2015-08-31 (×2): qty 1

## 2015-08-31 MED ORDER — NITROGLYCERIN 0.4 MG SL SUBL
0.4000 mg | SUBLINGUAL_TABLET | SUBLINGUAL | Status: DC | PRN
  Administered 2015-08-31 (×2): 0.4 mg via SUBLINGUAL
  Filled 2015-08-31 (×2): qty 1

## 2015-08-31 MED ORDER — OMEPRAZOLE MAGNESIUM 20 MG PO TBEC: 20.0000 mg | DELAYED_RELEASE_TABLET | Freq: Every day | ORAL | Status: DC

## 2015-08-31 NOTE — ED Notes (Signed)
Pt c/o center chest pain onset this morning. Pt reports pain also to left breast and left arm.

## 2015-08-31 NOTE — Progress Notes (Signed)
Pt received from ED RN. Pt oriented to unit and equipment. Pt expresses pain 1/10 in her L chest at this time describing it as pressure. Dinner tray ordered. Call light within reach. Will continue to monitor.   Leonidas Romberg, RN

## 2015-08-31 NOTE — ED Notes (Signed)
Ordered pt a heart healthy tray 

## 2015-08-31 NOTE — ED Notes (Signed)
Called service response to re route dinner tray. They state they are unable to do this dt the pt not being moved into the room.

## 2015-08-31 NOTE — H&P (Signed)
[facility] History and Physical  Susan Gallegos ZOX:[id number] DOB: [birth year] DOA: 08/31/2015  Referring physician:  PCP: Lillia Mountain, MD   Chief Complaint: Chest Pain  HPI: Susan Gallegos is a 75 y.o. female presenting with acute onset of substernal chest pain since 8:30 am described as 7/10. Pain was sharp, constant and stabbing in nature, substernal, not worsened with deep inspiration, movement or exertion. No jaw pain. No fever or chills. She reports some tingling in her left arm. Patient took an unknown amount of her home nitroglycerin without improvement as well as a baby aspirin. Patient states that she's had somewhat similar pain 3 years ago, seen at the ED, resolved with GI cocktail and NTG At the time, Echo and EKG were unrevealing. This time, she reports the pain to be slightly different. Upon NTG dose, pain went down to 3-4, not resolved. Did not receive GI cocktail. Denies any shortness of breath. Denies any nausea or vomiting. Denies any headaches or vision changes. She denies any seizures.She denies any confusion. Denies any dizziness or falls.  At the ED, her EKG shows possible anterior infarct age undetermined without changes since last EKG in 2014.  CTA negative for PE or masses. Her CMET and CBC are normal. Her troponins are pending  VSS, Afebrile and Sat 99 RA. Last 2 D Echo 06/2013 Vigorous VF EF 65% to 70%.    Review of Systems  Constitutional: Negative.   HENT: Negative.   Eyes: Negative.   Respiratory: Negative.   Cardiovascular: Positive for chest pain. Negative for palpitations, claudication, leg swelling and PND.  Gastrointestinal: Negative.   Genitourinary: Negative.   Musculoskeletal: Negative.   Skin: Negative.   Neurological: Negative for dizziness, tremors, focal weakness, seizures and loss of consciousness.  Endo/Heme/Allergies: Negative.   Psychiatric/Behavioral: Negative.     Past Medical History  Diagnosis Date  . Elevated cholesterol   .  Hypertension   . GERD (gastroesophageal reflux disease)   . Chest pain at rest 09/06/2012  . Arthritis     MILD IN HANDS "  . Osteopenia 06/2013    T score -1.1 FRAX 13%/2.5%   Past Surgical History  Procedure Laterality Date  . Vaginal hysterectomy     Social History:  reports that she has never smoked. She has never used smokeless tobacco. She reports that she does not drink alcohol or use illicit drugs. Retired. 2 children in good health  Allergies  Allergen Reactions  . Codeine Other (See Comments)    Hypes the patient.    Family History  Problem Relation Age of Onset  . Uterine cancer Mother   . Diabetes Father   . Heart disease Father   . Diabetes Sister   . Stroke Sister   . Diabetes Brother    Father died with MI  Prior to Admission medications   Medication Sig Start Date End Date Taking? Authorizing Provider  Calcium Carbonate-Vitamin D (CALCIUM + D PO) Take by mouth 2 (two) times daily.     Yes Historical Provider, MD  estradiol (CLIMARA - DOSED IN MG/24 HR) 0.0375 mg/24hr patch Place 1 patch (0.0375 mg total) onto the skin once a week. 04/29/15  Yes Harrington Challenger, NP  ezetimibe (ZETIA) 10 MG tablet Take 10 mg by mouth daily.   Yes Historical Provider, MD  hydrochlorothiazide (HYDRODIURIL) 25 MG tablet Take 12.5 mg by mouth daily.    Yes Historical Provider, MD  loratadine (CLARITIN) 10 MG tablet Take 10 mg by mouth daily.  Yes Historical Provider, MD  Multiple Vitamin (MULTIVITAMIN) tablet Take 1 tablet by mouth daily.    Yes Historical Provider, MD  omeprazole (PRILOSEC OTC) 20 MG tablet Take 1 tablet (20 mg total) by mouth daily. 09/08/12  Yes Benjaman Kindler, MD   Physical Exam: Filed Vitals:   08/31/15 0930 08/31/15 1011 08/31/15 1030 08/31/15 1100  BP: 174/86 114/68 126/64 113/77  Pulse: 76 93 72 74  Temp:      TempSrc:      Resp: 17 13 15 21   Height:      Weight:      SpO2: 100% 96% 97% 99%    Wt Readings from Last 3 Encounters:  08/31/15 48.535 kg  (107 lb)  04/29/15 48.081 kg (106 lb)  04/25/14 49.442 kg (109 lb)    Physical Exam  Constitutional: She is oriented to person, place, and time and well-developed, well-nourished, and in no distress.  HENT:  Head: Normocephalic and atraumatic.  Mouth/Throat: No oropharyngeal exudate.  Eyes: EOM are normal. Pupils are equal, round, and reactive to light.  Neck: Neck supple. No JVD present.  Cardiovascular: Normal rate and regular rhythm.  Exam reveals no gallop and no friction rub.   No murmur heard. Pulmonary/Chest: Effort normal and breath sounds normal. No stridor.  Abdominal: Soft. Bowel sounds are normal. She exhibits no distension and no mass. There is no tenderness.  Musculoskeletal: Normal range of motion. She exhibits no edema or tenderness.  Lymphadenopathy:    She has no cervical adenopathy.  Neurological: She is alert and oriented to person, place, and time. She has normal reflexes.  Skin: Skin is dry.  Psychiatric: Mood, memory, affect and judgment normal.            Labs on Admission:  Basic Metabolic Panel:  Recent Labs Lab 08/31/15 0942 08/31/15 1004  NA 134* 134*  K 3.4* 3.4*  CL 94* 93*  CO2 27  --   GLUCOSE 117* 111*  BUN 11 13  CREATININE 0.70 0.60  CALCIUM 9.4  --     Liver Function Tests: No results for input(s): AST, ALT, ALKPHOS, BILITOT, PROT, ALBUMIN in the last 168 hours. No results for input(s): LIPASE, AMYLASE in the last 168 hours. No results for input(s): AMMONIA in the last 168 hours.  CBC:  Recent Labs Lab 08/31/15 0942 08/31/15 1004  WBC 5.9  --   HGB 13.4 14.6  HCT 38.5 43.0  MCV 90.0  --   PLT 259  --     Cardiac Enzymes: No results for input(s): CKTOTAL, CKMB, CKMBINDEX, TROPONINI in the last 168 hours.  BNP (last 3 results) No results for input(s): BNP in the last 8760 hours.  ProBNP (last 3 results) No results for input(s): PROBNP in the last 8760 hours.   CREATININE: 0.6 (08/31/15 1004) Estimated creatinine  clearance - 47.2 mL/min  CBG: No results for input(s): GLUCAP in the last 168 hours.  Radiological Exams on Admission: Dg Chest 2 View  08/31/2015  CLINICAL DATA:  Central chest pain that radiates to the left breast and arm. EXAM: CHEST  2 VIEW COMPARISON:  09/07/2012 FINDINGS: The heart size and mediastinal contours are within normal limits. Both lungs are clear. The visualized skeletal structures are unremarkable. IMPRESSION: No active cardiopulmonary disease. Electronically Signed   By: Richarda Overlie M.D.   On: 08/31/2015 09:59   Ct Angio Chest Aorta W/cm &/or Wo/cm  08/31/2015  CLINICAL DATA:  Chest tightness.  Left breast and arm pain. EXAM:  CT ANGIOGRAPHY CHEST, ABDOMEN AND PELVIS TECHNIQUE: Multidetector CT imaging through the chest, abdomen and pelvis was performed using the standard protocol during bolus administration of intravenous contrast. Multiplanar reconstructed images and MIPs were obtained and reviewed to evaluate the vascular anatomy. CONTRAST:  OMNIPAQUE IOHEXOL 350 MG/ML SOLN COMPARISON:  None. FINDINGS: CTA CHEST FINDINGS Clustered tree-in-bud nodular densities are noted in the superior segment of the right lower lobe. Left lung is clear. No pleural effusions. Heart is normal size. Aorta is normal caliber. No dissection. No visible coronary artery calcifications. No filling defects in the pulmonary arteries to suggest pulmonary emboli. No mediastinal, hilar, or axillary adenopathy. Chest wall soft tissues are unremarkable. Review of the MIP images confirms the above findings. CTA ABDOMEN AND PELVIS FINDINGS Aorta and iliofemoral vessels are normal caliber. No dissection. Mesenteric vessels and renal arteries are widely patent (2 renal arteries on the left). Liver, gallbladder, spleen, pancreas, adrenals and left kidney are unremarkable. Small cyst which appears benign in the midpole of the right kidney. No hydronephrosis. Bowel grossly unremarkable.  No free fluid, free air, or  adenopathy. Prior hysterectomy. No adnexal masses. Urinary bladder is unremarkable. No acute bony abnormality or focal bone lesion. Review of the MIP images confirms the above findings. IMPRESSION: No evidence of aortic aneurysm or dissection. No evidence of pulmonary embolus. Small area of clustered nodular densities in the superior segment of the right lower lobe could reflect small airways disease/ alveolitis. Electronically Signed   By: Charlett Nose M.D.   On: 08/31/2015 11:39   Ct Angio Abd/pel W/ And/or W/o  08/31/2015  CLINICAL DATA:  Chest tightness.  Left breast and arm pain. EXAM: CT ANGIOGRAPHY CHEST, ABDOMEN AND PELVIS TECHNIQUE: Multidetector CT imaging through the chest, abdomen and pelvis was performed using the standard protocol during bolus administration of intravenous contrast. Multiplanar reconstructed images and MIPs were obtained and reviewed to evaluate the vascular anatomy. CONTRAST:  OMNIPAQUE IOHEXOL 350 MG/ML SOLN COMPARISON:  None. FINDINGS: CTA CHEST FINDINGS Clustered tree-in-bud nodular densities are noted in the superior segment of the right lower lobe. Left lung is clear. No pleural effusions. Heart is normal size. Aorta is normal caliber. No dissection. No visible coronary artery calcifications. No filling defects in the pulmonary arteries to suggest pulmonary emboli. No mediastinal, hilar, or axillary adenopathy. Chest wall soft tissues are unremarkable. Review of the MIP images confirms the above findings. CTA ABDOMEN AND PELVIS FINDINGS Aorta and iliofemoral vessels are normal caliber. No dissection. Mesenteric vessels and renal arteries are widely patent (2 renal arteries on the left). Liver, gallbladder, spleen, pancreas, adrenals and left kidney are unremarkable. Small cyst which appears benign in the midpole of the right kidney. No hydronephrosis. Bowel grossly unremarkable.  No free fluid, free air, or adenopathy. Prior hysterectomy. No adnexal masses. Urinary  bladder is unremarkable. No acute bony abnormality or focal bone lesion. Review of the MIP images confirms the above findings. IMPRESSION: No evidence of aortic aneurysm or dissection. No evidence of pulmonary embolus. Small area of clustered nodular densities in the superior segment of the right lower lobe could reflect small airways disease/ alveolitis. Electronically Signed   By: Charlett Nose M.D.   On: 08/31/2015 11:39     Assessment/Plan Active Problems:   Hypertension   GERD (gastroesophageal reflux disease)   Elevated cholesterol   Atypical chest pain   Chest heaviness   Chest pain   Chest pain syndrome/known CAD  HEART score 3-4. Troponin pending, EKG without evidence of  active ACS. CP unrelieved by nitroglycerin,aspirin. CXR unrevealing. Distal pulses intact.  - Admit to Telemetry/ Observation 2 D echo Cycle troponin EKG in am Continue ASA, O2 and NTG as needed Continue preadmission HCTZ Continue Zetia GI cocktail Stress test as outpatient   Hypertension Resume HCTZ Add hydralazine when necessary if BP >180 systolic and 100 diastolic Monitor step down telemetry  Hyperlipidemia   Continue home statins with Zetia  GERD Continue Omeprazole   Code Status: Full Code   DVT Prophylaxis: Lovenox Family Communication: husband at bedside Disposition Plan: Pending Improvement. Admitted for observation in tele bed. Expected LOS 24-48 hrs    [facility] E,PA-C [facility] [email or web address] Password [email or web address]

## 2015-08-31 NOTE — ED Provider Notes (Signed)
CSN: [id number]     Arrival date & time 08/31/15  0905 History   First MD Initiated Contact with Patient 08/31/15 [id number]     Chief Complaint  Patient presents with  . Chest Pain     (Consider location/radiation/quality/duration/timing/severity/associated sxs/prior Treatment) HPI  75 year old female presents with chest pain that started around 8:30 AM this morning. She was at rest when it started. It feels like a heaviness in the middle of her chest as well as her left breast is hurting. Patient feels like she can't quite get a full breath but there is no pleuritic pain. Transiently felt nausea but that has resolved. Patient feels like her upper arm is tingling as if it was coming out of having been asleep. There is no weakness in her arm. The pain does not radiate anywhere and there is no back pain. No diaphoresis. Pain is waxing and waning, nothing makes it better or worse History of hypertension and elevate cholesterol. Has history of GERD and has had chest pain from GERD in the past but she states this feels different. Her father died of an MI at age 7. She is on estradiol patch. Denies leg swelling, leg pain, immobilization, or hemoptysis.  After workup started, husband comes in and tells me that on initial onset of pain, he felt for her pulse and her left wrist and couldn't feel one. Felt a normal/strong pulse in her right wrist. Now when he is testing he can feel her pulse like normal but thinks it might be a little bit decreased.  Past Medical History  Diagnosis Date  . Elevated cholesterol   . Hypertension   . GERD (gastroesophageal reflux disease)   . Chest pain at rest 09/06/2012  . Arthritis     MILD IN HANDS "  . Osteopenia 06/2013    T score -1.1 FRAX 13%/2.5%   Past Surgical History  Procedure Laterality Date  . Vaginal hysterectomy     Family History  Problem Relation Age of Onset  . Uterine cancer Mother   . Diabetes Father   . Heart disease Father   . Diabetes Sister    . Stroke Sister   . Diabetes Brother    Social History  Substance Use Topics  . Smoking status: Never Smoker   . Smokeless tobacco: Never Used  . Alcohol Use: No   OB History    Gravida Para Term Preterm AB TAB SAB Ectopic Multiple Living   Review of Systems  Constitutional: Negative for diaphoresis.  Respiratory: Positive for shortness of breath.   Cardiovascular: Positive for chest pain. Negative for leg swelling.  Gastrointestinal: Positive for nausea. Negative for vomiting and abdominal pain.  Musculoskeletal: Negative for back pain.  Neurological: Positive for numbness. Negative for weakness.  All other systems reviewed and are negative.     Allergies  Codeine  Home Medications   Prior to Admission medications   Medication Sig Start Date End Date Taking? Authorizing Provider  Calcium Carbonate-Vitamin D (CALCIUM + D PO) Take by mouth 2 (two) times daily.      Historical Provider, MD  estradiol (CLIMARA - DOSED IN MG/24 HR) 0.0375 mg/24hr patch Place 1 patch (0.0375 mg total) onto the skin once a week. 04/29/15   Harrington Challenger, NP  ezetimibe (ZETIA) 10 MG tablet Take 10 mg by mouth daily.    Historical Provider, MD  hydrochlorothiazide (HYDRODIURIL) 25 MG tablet Take  1/2 pill    Historical Provider, MD  loratadine (CLARITIN) 10 MG tablet Take 10 mg by mouth daily.    Historical Provider, MD  Multiple Vitamin (MULTIVITAMIN) tablet Take 1 tablet by mouth daily.     Historical Provider, MD  omeprazole (PRILOSEC OTC) 20 MG tablet Take 1 tablet (20 mg total) by mouth daily. 09/08/12   Benjaman Kindler, MD   BP 179/79 mmHg  Pulse 69  Temp(Src) 98.1 F (36.7 C) (Oral)  Resp 12  Ht 5\' 2"  (1.575 m)  Wt 107 lb (48.535 kg)  BMI 19.57 kg/m2  SpO2 100% Physical Exam  Constitutional: She is oriented to person, place, and time. She appears well-developed and well-nourished. No distress.  HENT:  Head: Normocephalic and atraumatic.  Right Ear: External ear  normal.  Left Ear: External ear normal.  Nose: Nose normal.  Eyes: Right eye exhibits no discharge. Left eye exhibits no discharge.  Cardiovascular: Normal rate, regular rhythm and normal heart sounds.   Pulses:      Radial pulses are 2+ on the right side, and 2+ on the left side.  BP similar in both arms  Pulmonary/Chest: Effort normal and breath sounds normal. She exhibits no tenderness.  Abdominal: Soft. There is no tenderness.  Neurological: She is alert and oriented to person, place, and time.  Equal strength and sensation in bilateral upper extremities  Skin: Skin is warm and dry. She is not diaphoretic.  Nursing note and vitals reviewed.   ED Course  Procedures (including critical care time) Labs Review Labs Reviewed  BASIC METABOLIC PANEL - Abnormal; Notable for the following:    Sodium 134 (*)    Potassium 3.4 (*)    Chloride 94 (*)    Glucose, Bld 117 (*)    All other components within normal limits  I-STAT CHEM 8, ED - Abnormal; Notable for the following:    Sodium 134 (*)    Potassium 3.4 (*)    Chloride 93 (*)    Glucose, Bld 111 (*)    Calcium, Ion 1.05 (*)    All other components within normal limits  CBC  TROPONIN I  TROPONIN I  CBC  TROPONIN I  I-STAT TROPOININ, ED    Imaging Review Dg Chest 2 View  08/31/2015  CLINICAL DATA:  Central chest pain that radiates to the left breast and arm. EXAM: CHEST  2 VIEW COMPARISON:  09/07/2012 FINDINGS: The heart size and mediastinal contours are within normal limits. Both lungs are clear. The visualized skeletal structures are unremarkable. IMPRESSION: No active cardiopulmonary disease. Electronically Signed   By: Richarda Overlie M.D.   On: 08/31/2015 09:59   Ct Angio Chest Aorta W/cm &/or Wo/cm  08/31/2015  CLINICAL DATA:  Chest tightness.  Left breast and arm pain. EXAM: CT ANGIOGRAPHY CHEST, ABDOMEN AND PELVIS TECHNIQUE: Multidetector CT imaging through the chest, abdomen and pelvis was performed using the standard  protocol during bolus administration of intravenous contrast. Multiplanar reconstructed images and MIPs were obtained and reviewed to evaluate the vascular anatomy. CONTRAST:  OMNIPAQUE IOHEXOL 350 MG/ML SOLN COMPARISON:  None. FINDINGS: CTA CHEST FINDINGS Clustered tree-in-bud nodular densities are noted in the superior segment of the right lower lobe. Left lung is clear. No pleural effusions. Heart is normal size. Aorta is normal caliber. No dissection. No visible coronary artery calcifications. No filling defects in the pulmonary arteries to suggest pulmonary emboli. No mediastinal, hilar, or axillary adenopathy. Chest wall soft tissues are unremarkable. Review of the MIP  images confirms the above findings. CTA ABDOMEN AND PELVIS FINDINGS Aorta and iliofemoral vessels are normal caliber. No dissection. Mesenteric vessels and renal arteries are widely patent (2 renal arteries on the left). Liver, gallbladder, spleen, pancreas, adrenals and left kidney are unremarkable. Small cyst which appears benign in the midpole of the right kidney. No hydronephrosis. Bowel grossly unremarkable.  No free fluid, free air, or adenopathy. Prior hysterectomy. No adnexal masses. Urinary bladder is unremarkable. No acute bony abnormality or focal bone lesion. Review of the MIP images confirms the above findings. IMPRESSION: No evidence of aortic aneurysm or dissection. No evidence of pulmonary embolus. Small area of clustered nodular densities in the superior segment of the right lower lobe could reflect small airways disease/ alveolitis. Electronically Signed   By: Charlett Nose M.D.   On: 08/31/2015 11:39   Ct Angio Abd/pel W/ And/or W/o  08/31/2015  CLINICAL DATA:  Chest tightness.  Left breast and arm pain. EXAM: CT ANGIOGRAPHY CHEST, ABDOMEN AND PELVIS TECHNIQUE: Multidetector CT imaging through the chest, abdomen and pelvis was performed using the standard protocol during bolus administration of intravenous contrast.  Multiplanar reconstructed images and MIPs were obtained and reviewed to evaluate the vascular anatomy. CONTRAST:  OMNIPAQUE IOHEXOL 350 MG/ML SOLN COMPARISON:  None. FINDINGS: CTA CHEST FINDINGS Clustered tree-in-bud nodular densities are noted in the superior segment of the right lower lobe. Left lung is clear. No pleural effusions. Heart is normal size. Aorta is normal caliber. No dissection. No visible coronary artery calcifications. No filling defects in the pulmonary arteries to suggest pulmonary emboli. No mediastinal, hilar, or axillary adenopathy. Chest wall soft tissues are unremarkable. Review of the MIP images confirms the above findings. CTA ABDOMEN AND PELVIS FINDINGS Aorta and iliofemoral vessels are normal caliber. No dissection. Mesenteric vessels and renal arteries are widely patent (2 renal arteries on the left). Liver, gallbladder, spleen, pancreas, adrenals and left kidney are unremarkable. Small cyst which appears benign in the midpole of the right kidney. No hydronephrosis. Bowel grossly unremarkable.  No free fluid, free air, or adenopathy. Prior hysterectomy. No adnexal masses. Urinary bladder is unremarkable. No acute bony abnormality or focal bone lesion. Review of the MIP images confirms the above findings. IMPRESSION: No evidence of aortic aneurysm or dissection. No evidence of pulmonary embolus. Small area of clustered nodular densities in the superior segment of the right lower lobe could reflect small airways disease/ alveolitis. Electronically Signed   By: Charlett Nose M.D.   On: 08/31/2015 11:39   I have personally reviewed and evaluated these images and lab results as part of my medical decision-making.   EKG Interpretation   Date/Time:  Saturday August 31 2015 09:11:19 EST Ventricular Rate:  78 PR Interval:  152 QRS Duration: 84 QT Interval:  420 QTC Calculation: 478 R Axis:   26 Text Interpretation:  Normal sinus rhythm Right atrial enlargement  Possible  Anterior infarct , age undetermined Abnormal ECG no significant  change since 2014 Confirmed by Patriciaann Rabanal  MD, Casee Knepp [phone]) on 08/31/2015  9:24:46 AM       EKG Interpretation  Date/Time:  Saturday August 31 2015 12:27:39 EST Ventricular Rate:  65 PR Interval:  144 QRS Duration: 90 QT Interval:  452 QTC Calculation: 470 R Axis:   20 Text Interpretation:  Sinus rhythm Low voltage, precordial leads no significant change since earlier in the day Confirmed by Reggie Bise  MD, Rosita Guzzetta ([id number]) on 08/31/2015 5:43:29 PM       MDM   Final  diagnoses:  Chest heaviness    Patient has a moderate HEART score. Pain resolved with nitroglycerin. Given husband's report of no palpable pulse in LUE (although it is normal here with equal BPs in arms) a CT was performed and shows no PE or dissection. Will admit to hospitalist for ACS r/o and observation. One recurrent episode of pain in ED but no EKG changes.    Pricilla Loveless, MD 08/31/15 [phone]

## 2015-09-01 ENCOUNTER — Other Ambulatory Visit: Payer: Self-pay | Admitting: Student

## 2015-09-01 ENCOUNTER — Observation Stay (HOSPITAL_BASED_OUTPATIENT_CLINIC_OR_DEPARTMENT_OTHER): Payer: BLUE CROSS/BLUE SHIELD

## 2015-09-01 DIAGNOSIS — I10 Essential (primary) hypertension: Secondary | ICD-10-CM | POA: Diagnosis not present

## 2015-09-01 DIAGNOSIS — K219 Gastro-esophageal reflux disease without esophagitis: Secondary | ICD-10-CM

## 2015-09-01 DIAGNOSIS — R079 Chest pain, unspecified: Secondary | ICD-10-CM | POA: Diagnosis not present

## 2015-09-01 DIAGNOSIS — R0789 Other chest pain: Secondary | ICD-10-CM

## 2015-09-01 DIAGNOSIS — R0602 Shortness of breath: Secondary | ICD-10-CM

## 2015-09-01 DIAGNOSIS — E785 Hyperlipidemia, unspecified: Secondary | ICD-10-CM | POA: Diagnosis not present

## 2015-09-01 DIAGNOSIS — E7800 Pure hypercholesterolemia, unspecified: Secondary | ICD-10-CM | POA: Diagnosis not present

## 2015-09-01 DIAGNOSIS — R5383 Other fatigue: Secondary | ICD-10-CM

## 2015-09-01 LAB — LIPID PANEL
Cholesterol: 201 mg/dL — ABNORMAL HIGH (ref 0–200)
HDL: 65 mg/dL (ref >40)
LDL Cholesterol: 97 mg/dL (ref 0–99)
Total CHOL/HDL Ratio: 3.1 RATIO
Triglycerides: 194 mg/dL — ABNORMAL HIGH (ref <150)
VLDL: 39 mg/dL (ref 0–40)

## 2015-09-01 LAB — BASIC METABOLIC PANEL
Anion gap: 10 (ref 5–15)
BUN: 11 mg/dL (ref 6–20)
CO2: 27 mmol/L (ref 22–32)
Calcium: 9.2 mg/dL (ref 8.9–10.3)
Chloride: 101 mmol/L (ref 101–111)
Creatinine, Ser: 0.71 mg/dL (ref 0.44–1.00)
GFR calc Af Amer: >60 mL/min (ref >60)
GFR calc non Af Amer: >60 mL/min (ref >60)
Glucose, Bld: 109 mg/dL — ABNORMAL HIGH (ref 65–99)
Potassium: 4.4 mmol/L (ref 3.5–5.1)
Sodium: 138 mmol/L (ref 135–145)

## 2015-09-01 LAB — HEPATIC FUNCTION PANEL
ALT: 22 U/L (ref 14–54)
AST: 22 U/L (ref 15–41)
Albumin: 3.5 g/dL (ref 3.5–5.0)
Alkaline Phosphatase: 60 U/L (ref 38–126)
Bilirubin, Direct: <0.1 mg/dL — ABNORMAL LOW (ref 0.1–0.5)
Total Bilirubin: 0.6 mg/dL (ref 0.3–1.2)
Total Protein: 6.4 g/dL — ABNORMAL LOW (ref 6.5–8.1)

## 2015-09-01 LAB — MAGNESIUM: Magnesium: 1.8 mg/dL (ref 1.7–2.4)

## 2015-09-01 MED ORDER — NITROGLYCERIN 0.4 MG SL SUBL
0.4000 mg | SUBLINGUAL_TABLET | SUBLINGUAL | Status: DC | PRN
Start: 2015-09-01 — End: 2018-05-04

## 2015-09-01 MED ORDER — ASPIRIN EC 81 MG PO TBEC: 81.0000 mg | DELAYED_RELEASE_TABLET | Freq: Every day | ORAL | Status: DC

## 2015-09-01 NOTE — Progress Notes (Signed)
  Echocardiogram 2D Echocardiogram has been performed.  Delcie Roch 09/01/2015, 11:35 AM

## 2015-09-01 NOTE — Discharge Summary (Signed)
Discharge Summary  Susan Gallegos:[id number] DOB: 1940-09-17  PCP: Lillia Mountain, MD  Admit date: 08/31/2015 Discharge date: 09/01/2015  Time spent: >34mins  Recommendations for Outpatient Follow-up:  1. F/u with PMD within a week  for hospital discharge follow up, pmd to refer patient to GI for possible EGD to eval for GERD. 2. F/u with cardiology for outpatient stress test  Discharge Diagnoses:  Active Hospital Problems   Diagnosis Date Noted  . Chest heaviness 08/31/2015  . Chest pain 08/31/2015  . Atypical chest pain 09/07/2012  . Elevated cholesterol   . GERD (gastroesophageal reflux disease) 05/25/2011  . Hypertension     Resolved Hospital Problems   Diagnosis Date Noted Date Resolved  No resolved problems to display.    Discharge Condition: stable  Diet recommendation: heart healthy  Filed Weights   08/31/15 0915 08/31/15 1709  Weight: 48.535 kg (107 lb) 48.898 kg (107 lb 12.8 oz)    History of present illness:  Susan Gallegos is a 75 y.o. female presenting with acute onset of substernal chest pain since 8:30 am described as 7/10. Pain was sharp, constant and stabbing in nature, substernal, not worsened with deep inspiration, movement or exertion. No jaw pain. No fever or chills. She reports some tingling in her left arm. Patient took an unknown amount of her home nitroglycerin without improvement as well as a baby aspirin. Patient states that she's had somewhat similar pain 3 years ago, seen at the ED, resolved with GI cocktail and NTG At the time, Echo and EKG were unrevealing. This time, she reports the pain to be slightly different. Upon NTG dose, pain went down to 3-4, not resolved. Did not receive GI cocktail. Denies any shortness of breath. Denies any nausea or vomiting. Denies any headaches or vision changes. She denies any seizures.She denies any confusion. Denies any dizziness or falls.  At the ED, her EKG shows possible anterior infarct age undetermined  without changes since last EKG in 2014. CTA negative for PE or masses. Her CMET and CBC are normal. Her troponins are pending VSS, Afebrile and Sat 99 RA. Last 2 D Echo 06/2013 Vigorous VF EF 65% to 70%.  Hospital Course:  Active Problems:   Hypertension   GERD (gastroesophageal reflux disease)   Elevated cholesterol   Atypical chest pain   Chest heaviness   Chest pain  Chest pain syndrome/known CAD HEART score 3-4. Troponin negative, EKG without evidence of active ACS. CP unrelieved by nitroglycerin,aspirin. CXR unrevealing. Distal pulses intact.  -2 D echo no wall motion abnormality, with preserved LVEF ldl 97, triglycerides 194 Continue [facility] consult appreciated, Stress test as outpatient  Hypertension Stable on home meds HCTZ  Hyperlipidemia  ldl 97, triglycerides 194 Continue home meds.  GERD Continue Omeprazole, pmd to refer patient to GI for possible EGD   Code Status: Full Code  Family Communication: daughter and husband at bedside Disposition Plan: home   Consultations:  cardiology  Discharge Exam: BP 130/50 mmHg  Pulse 80  Temp(Src) 97.8 F (36.6 C) (Oral)  Resp 18  Ht 5\' 2"  (1.575 m)  Wt 48.898 kg (107 lb 12.8 oz)  BMI 19.71 kg/m2  SpO2 100%  General: aaox3 Cardiovascular: RRR Respiratory: CTABL Ab; soft, NT Extremity: no edema  Discharge Instructions You were cared for by a hospitalist during your hospital stay. If you have any questions about your discharge medications or the care you received while you were in the hospital after you are discharged, you can  call the unit and asked to speak with the hospitalist on call if the hospitalist that took care of you is not available. Once you are discharged, your primary care physician will handle any further medical issues. Please note that NO REFILLS for any discharge medications will be authorized once you are discharged, as it is imperative that you return to your primary care  physician (or establish a relationship with a primary care physician if you do not have one) for your aftercare needs so that they can reassess your need for medications and monitor your lab values.      Discharge Instructions    Diet - low sodium heart healthy    Complete by:  As directed      Increase activity slowly    Complete by:  As directed             Medication List    TAKE these medications        aspirin EC 81 MG tablet  Take 1 tablet (81 mg total) by mouth daily.     CALCIUM + D PO  Take by mouth 2 (two) times daily.     estradiol 0.0375 mg/24hr patch  Commonly known as:  CLIMARA - Dosed in mg/24 hr  Place 1 patch (0.0375 mg total) onto the skin once a week.     ezetimibe 10 MG tablet  Commonly known as:  ZETIA  Take 10 mg by mouth daily.     hydrochlorothiazide 25 MG tablet  Commonly known as:  HYDRODIURIL  Take 12.5 mg by mouth daily.     loratadine 10 MG tablet  Commonly known as:  CLARITIN  Take 10 mg by mouth daily.     multivitamin tablet  Take 1 tablet by mouth daily.     nitroGLYCERIN 0.4 MG SL tablet  Commonly known as:  NITROSTAT  Place 1 tablet (0.4 mg total) under the tongue every 5 (five) minutes as needed for chest pain (CP or SOB).     omeprazole 20 MG tablet  Commonly known as:  PRILOSEC OTC  Take 1 tablet (20 mg total) by mouth daily.       Allergies  Allergen Reactions  . Codeine Other (See Comments)    Hypes the patient.   Follow-up Information    Follow up with [facility].   Specialty:  Cardiology   Why:  The office will call you to arrange your stress test and follow-up appointment.    Contact information:   [address] [place] [address] [phone]      Follow up with Lillia Mountain, MD In 1 week.   Specialty:  Internal Medicine   Why:  hospital discharge follow up, pmd to refer patient to gastroenterology for possible EGD/gallbladder eval.    Contact  information:   [address] [place] [address] [phone]        The results of significant diagnostics from this hospitalization (including imaging, microbiology, ancillary and laboratory) are listed below for reference.    Significant Diagnostic Studies: Dg Chest 2 View  08/31/2015  CLINICAL DATA:  Central chest pain that radiates to the left breast and arm. EXAM: CHEST  2 VIEW COMPARISON:  09/07/2012 FINDINGS: The heart size and mediastinal contours are within normal limits. Both lungs are clear. The visualized skeletal structures are unremarkable. IMPRESSION: No active cardiopulmonary disease. Electronically Signed   By: Richarda Overlie M.D.   On: 08/31/2015  09:59   Ct Angio Chest Aorta W/cm &/or Wo/cm  08/31/2015  CLINICAL DATA:  Chest tightness.  Left breast and arm pain. EXAM: CT ANGIOGRAPHY CHEST, ABDOMEN AND PELVIS TECHNIQUE: Multidetector CT imaging through the chest, abdomen and pelvis was performed using the standard protocol during bolus administration of intravenous contrast. Multiplanar reconstructed images and MIPs were obtained and reviewed to evaluate the vascular anatomy. CONTRAST:  OMNIPAQUE IOHEXOL 350 MG/ML SOLN COMPARISON:  None. FINDINGS: CTA CHEST FINDINGS Clustered tree-in-bud nodular densities are noted in the superior segment of the right lower lobe. Left lung is clear. No pleural effusions. Heart is normal size. Aorta is normal caliber. No dissection. No visible coronary artery calcifications. No filling defects in the pulmonary arteries to suggest pulmonary emboli. No mediastinal, hilar, or axillary adenopathy. Chest wall soft tissues are unremarkable. Review of the MIP images confirms the above findings. CTA ABDOMEN AND PELVIS FINDINGS Aorta and iliofemoral vessels are normal caliber. No dissection. Mesenteric vessels and renal arteries are widely patent (2 renal arteries on the left). Liver, gallbladder, spleen, pancreas, adrenals and left kidney  are unremarkable. Small cyst which appears benign in the midpole of the right kidney. No hydronephrosis. Bowel grossly unremarkable.  No free fluid, free air, or adenopathy. Prior hysterectomy. No adnexal masses. Urinary bladder is unremarkable. No acute bony abnormality or focal bone lesion. Review of the MIP images confirms the above findings. IMPRESSION: No evidence of aortic aneurysm or dissection. No evidence of pulmonary embolus. Small area of clustered nodular densities in the superior segment of the right lower lobe could reflect small airways disease/ alveolitis. Electronically Signed   By: Charlett Nose M.D.   On: 08/31/2015 11:39   Ct Angio Abd/pel W/ And/or W/o  08/31/2015  CLINICAL DATA:  Chest tightness.  Left breast and arm pain. EXAM: CT ANGIOGRAPHY CHEST, ABDOMEN AND PELVIS TECHNIQUE: Multidetector CT imaging through the chest, abdomen and pelvis was performed using the standard protocol during bolus administration of intravenous contrast. Multiplanar reconstructed images and MIPs were obtained and reviewed to evaluate the vascular anatomy. CONTRAST:  OMNIPAQUE IOHEXOL 350 MG/ML SOLN COMPARISON:  None. FINDINGS: CTA CHEST FINDINGS Clustered tree-in-bud nodular densities are noted in the superior segment of the right lower lobe. Left lung is clear. No pleural effusions. Heart is normal size. Aorta is normal caliber. No dissection. No visible coronary artery calcifications. No filling defects in the pulmonary arteries to suggest pulmonary emboli. No mediastinal, hilar, or axillary adenopathy. Chest wall soft tissues are unremarkable. Review of the MIP images confirms the above findings. CTA ABDOMEN AND PELVIS FINDINGS Aorta and iliofemoral vessels are normal caliber. No dissection. Mesenteric vessels and renal arteries are widely patent (2 renal arteries on the left). Liver, gallbladder, spleen, pancreas, adrenals and left kidney are unremarkable. Small cyst which appears benign in the midpole  of the right kidney. No hydronephrosis. Bowel grossly unremarkable.  No free fluid, free air, or adenopathy. Prior hysterectomy. No adnexal masses. Urinary bladder is unremarkable. No acute bony abnormality or focal bone lesion. Review of the MIP images confirms the above findings. IMPRESSION: No evidence of aortic aneurysm or dissection. No evidence of pulmonary embolus. Small area of clustered nodular densities in the superior segment of the right lower lobe could reflect small airways disease/ alveolitis. Electronically Signed   By: Charlett Nose M.D.   On: 08/31/2015 11:39    Microbiology: No results found for this or any previous visit (from the past 240 hour(s)).   Labs: Basic Metabolic Panel:  Recent Labs Lab 08/31/15 0942 08/31/15 1004 09/01/15 0838  NA 134* 134* 138  K 3.4* 3.4* 4.4  CL 94* 93* 101  CO2 27  --  27  GLUCOSE 117* 111* 109*  BUN CREATININE 0.70 0.60 0.71  CALCIUM 9.4  --  9.2  MG  --   --  1.8   Liver Function Tests:  Recent Labs Lab 09/01/15 0838  AST 22  ALT 22  ALKPHOS 60  BILITOT 0.6  PROT 6.4*  ALBUMIN 3.5   No results for input(s): LIPASE, AMYLASE in the last 168 hours. No results for input(s): AMMONIA in the last 168 hours. CBC:  Recent Labs Lab 08/31/15 0942 08/31/15 1004 08/31/15 1319  WBC 5.9  --  6.4  HGB 13.4 14.6 13.0  HCT 38.5 43.0 37.6  MCV 90.0  --  90.4  PLT 259  --  250   Cardiac Enzymes:  Recent Labs Lab 08/31/15 1319 08/31/15 1530 08/31/15 1823  TROPONINI <0.03 <0.03 <0.03   BNP: BNP (last 3 results) No results for input(s): BNP in the last 8760 hours.  ProBNP (last 3 results) No results for input(s): PROBNP in the last 8760 hours.  CBG: No results for input(s): GLUCAP in the last 168 hours.     SignedAlbertine Grates MD, PhD  [facility] 09/01/2015, 4:07 PM

## 2015-09-01 NOTE — Progress Notes (Signed)
Discussed with the patient and her husband and all questioned fully answered. She will call me if any problems arise. Discussed addition of aspirin and nitroglycerin tablets. Teach back utilized on how to take nitroglycerin and when to call EMS vs the MD.   Leonidas Romberg, RN

## 2015-09-01 NOTE — Discharge Instructions (Signed)
You have a Stress Test scheduled at [facility]. Your doctor has ordered this test to get a better idea of how your heart works. ° °Please arrive 15 minutes early for paperwork.  ° °Location: °[address], [address] °[place], [place] [address] °[phone] ° °Instructions: °· No food/drink after midnight the night before.  °· No caffeine/decaf products 24 hours before, including medicines such as Excedrin or Goody Powders. Call if there are any questions.  °· Wear comfortable clothes and shoes.  °· It is OK to take your morning meds with a sip of water EXCEPT for those types of medicines listed below or otherwise instructed. ° °Special Medication Instructions: °· Beta blockers such as metoprolol (Lopressor/Toprol XL), atenolol (Tenormin), carvedilol (Coreg), nebivolol (Bystolic), propranolol (Inderal) should not be taken for 24 hours before the test. °· Calcium channel blockers such as diltiazem (Cardizem) or verapmil (Calan) should not be taken for 24 hours before the test. °· Remove nitroglycerin patches and do not take nitrate preparations such as Imdur/isosorbide the day of your test. °· No Persantine/Theophylline or Aggrenox medicines should be used within 24 hours of the test.  ° °What To Expect: °The whole test will take several hours. When you arrive in the lab, the technician will inject a small amount of radioactive tracer into your arm through an IV while you are resting quietly. This helps us to form pictures of your heart. You will likely only feel a sting from the IV. After a waiting period, resting pictures will be obtained under a big camera. These are the "before" pictures. ° °Next, you will be prepped for the stress portion of the test. This may include either walking on a treadmill or receiving a medicine that helps to dilate blood vessels in your heart to simulate the effect of exercise on your heart. If you are walking on a treadmill, you will walk at different paces to  try to get your heart rate to a goal number that is based on your age. If your doctor has chosen the pharmacologic test, then you will receive a medicine through your IV that may cause temporary nausea, flushing, shortness of breath and sometimes chest discomfort or vomiting. This is typically short-lived and usually resolves quickly. Your blood pressure and heart rate will be monitored, and we will be watching your EKG on a computer screen for any changes. During this portion of the test, the radiologist will inject another small amount of radioactive tracer into your IV. After a waiting period, you will undergo a second set of pictures. These are the "after" pictures. ° °The doctor reading the test will compare the before-and-after images to look for evidence of heart blockages or heart weakness. In certain instances, this test is done over 2 days but usually only takes 1 day to complete. ° °

## 2015-09-01 NOTE — Consult Note (Signed)
CARDIOLOGY CONSULT NOTE  Patient ID: Susan Gallegos MRN: [id number] DOB/AGE: 74/74/1942 74 y.o.  Admit date: 08/31/2015 Primary Physician Lillia Mountain, MD  Reason for Consultation: chest pain Consulting MD: Susie Cassette  HPI: 75 yr old woman with hypertension and hyperlipidemia as well as GERD admitted with chest pain. Awoke with chest pain yesterday morning associated with some shortness of breath and radiation of pain into left arm. Troponins, thoracic imaging, and ECG unremarkable. Has tried to start walking and has noticed some mild shortness of breath. Has perhaps felt more fatigued in past 12 months. Did receive GI cocktail earlier with relief of symptoms. Denies cardiac history (MI) but has had chest pain in past and underwent stress testing in 09/2012 when evaluated by Dr. Eldridge Dace.  Lipid panel shows TC 201, TG 194.   Allergies  Allergen Reactions  . Codeine Other (See Comments)    Hypes the patient.    Current Facility-Administered Medications  Medication Dose Route Frequency Provider Last Rate Last Dose  . acetaminophen (TYLENOL) tablet 650 mg  650 mg Oral Q4H PRN Marcos Eke, PA-C      . enoxaparin (LOVENOX) injection 40 mg  40 mg Subcutaneous Q24H Marcos Eke, PA-C   40 mg at 08/31/15 1638  . estradiol (CLIMARA - Dosed in mg/24 hr) patch 0.0375 mg  0.0375 mg Transdermal Weekly Marcos Eke, PA-C   0.0375 mg at 09/01/15 0919  . ezetimibe (ZETIA) tablet 10 mg  10 mg Oral Daily Marcos Eke, PA-C   10 mg at 09/01/15 4098  . gi cocktail (Maalox,Lidocaine,Donnatal)  30 mL Oral QID PRN Marcos Eke, PA-C   30 mL at 09/01/15 0920  . nitroGLYCERIN (NITROSTAT) SL tablet 0.4 mg  0.4 mg Sublingual Q5 min PRN Pricilla Loveless, MD   0.4 mg at 08/31/15 1043  . ondansetron (ZOFRAN) injection 4 mg  4 mg Intravenous Q6H PRN Marcos Eke, PA-C      . pantoprazole (PROTONIX) EC tablet 40 mg  40 mg Oral Daily Faye Ramsay Rumbarger, RPH   40 mg at 09/01/15 1191    Past  Medical History  Diagnosis Date  . Elevated cholesterol   . Hypertension   . GERD (gastroesophageal reflux disease)   . Chest pain at rest 09/06/2012  . Arthritis     MILD IN HANDS "  . Osteopenia 06/2013    T score -1.1 FRAX 13%/2.5%    Past Surgical History  Procedure Laterality Date  . Vaginal hysterectomy      Social History   Social History  . Marital Status: Married    Spouse Name: N/A  . Number of Children: N/A  . Years of Education: N/A   Occupational History  . Not on file.   Social History Main Topics  . Smoking status: Never Smoker   . Smokeless tobacco: Never Used  . Alcohol Use: No  . Drug Use: No  . Sexual Activity: Yes    Birth Control/ Protection: Surgical   Other Topics Concern  . Not on file   Social History Narrative     No family history of premature CAD in 1st degree relatives.  Prior to Admission medications   Medication Sig Start Date End Date Taking? Authorizing Provider  Calcium Carbonate-Vitamin D (CALCIUM + D PO) Take by mouth 2 (two) times daily.     Yes Historical Provider, MD  estradiol (CLIMARA - DOSED IN MG/24 HR) 0.0375 mg/24hr patch Place 1 patch (0.0375  mg total) onto the skin once a week. 04/29/15  Yes Harrington Challenger, NP  ezetimibe (ZETIA) 10 MG tablet Take 10 mg by mouth daily.   Yes Historical Provider, MD  hydrochlorothiazide (HYDRODIURIL) 25 MG tablet Take 12.5 mg by mouth daily.    Yes Historical Provider, MD  loratadine (CLARITIN) 10 MG tablet Take 10 mg by mouth daily.   Yes Historical Provider, MD  Multiple Vitamin (MULTIVITAMIN) tablet Take 1 tablet by mouth daily.    Yes Historical Provider, MD  omeprazole (PRILOSEC OTC) 20 MG tablet Take 1 tablet (20 mg total) by mouth daily. 09/08/12  Yes Benjaman Kindler, MD     Review of systems complete and found to be negative unless listed above in HPI     Physical exam Blood pressure 130/50, pulse 80, temperature 97.8 F (36.6 C), temperature source Oral, resp. rate 18,  height  (1.575 m), weight 107 lb 12.8 oz (48.898 kg), SpO2 100 %. General: NAD Neck: No JVD, no thyromegaly or thyroid nodule.  Lungs: Clear to auscultation bilaterally with normal respiratory effort. CV: Nondisplaced PMI. Regular rate and rhythm, normal S1/S2, no S3/S4, no murmur.  No peripheral edema.  No carotid bruit.  Normal pedal pulses.  Abdomen: Soft, nontender, no hepatosplenomegaly, no distention.  Skin: Intact without lesions or rashes.  Neurologic: Alert and oriented x 3.  Psych: Normal affect. Extremities: No clubbing or cyanosis.  HEENT: Normal.   ECG: Most recent ECG reviewed.  Labs:   Lab Results  Component Value Date   WBC 6.4 08/31/2015   HGB 13.0 08/31/2015   HCT 37.6 08/31/2015   MCV 90.4 08/31/2015   PLT 250 08/31/2015    Recent Labs Lab 09/01/15 0838  NA 138  K 4.4  CL 101  CO2 27  BUN 11  CREATININE 0.71  CALCIUM 9.2  PROT 6.4*  BILITOT 0.6  ALKPHOS 60  ALT 22  AST 22  GLUCOSE 109*   Lab Results  Component Value Date   CKTOTAL 22 04/10/2010   CKMB 0.9 04/10/2010   TROPONINI <0.03 08/31/2015    Lab Results  Component Value Date   CHOL 201* 09/01/2015   CHOL  04/10/2010    196        ATP III CLASSIFICATION:  <200     mg/dL   Desirable  161-096  mg/dL   Borderline High  >=045    mg/dL   High          Lab Results  Component Value Date   HDL 65 09/01/2015   HDL 57 04/10/2010   Lab Results  Component Value Date   LDLCALC 97 09/01/2015   LDLCALC  04/10/2010    UNABLE TO CALCULATE IF TRIGLYCERIDE OVER 400 mg/dL        Total Cholesterol/HDL:CHD Risk Coronary Heart Disease Risk Table                     Men   Women  1/2 Average Risk   3.4   3.3  Average Risk       5.0   4.4  2 X Average Risk   9.6   7.1  3 X Average Risk  23.4   11.0        Use the calculated Patient Ratio above and the CHD Risk Table to determine the patient's CHD Risk.        ATP III CLASSIFICATION (LDL):  <100     mg/dL   Optimal  100-129  mg/dL    Near or Above                    Optimal  130-159  mg/dL   Borderline  161-096  mg/dL   High  >045     mg/dL   Very High   Lab Results  Component Value Date   TRIG 194* 09/01/2015   TRIG 434* 04/10/2010   Lab Results  Component Value Date   CHOLHDL 3.1 09/01/2015   CHOLHDL 3.4 04/10/2010   No results found for: LDLDIRECT       Studies: Dg Chest 2 View  08/31/2015  CLINICAL DATA:  Central chest pain that radiates to the left breast and arm. EXAM: CHEST  2 VIEW COMPARISON:  09/07/2012 FINDINGS: The heart size and mediastinal contours are within normal limits. Both lungs are clear. The visualized skeletal structures are unremarkable. IMPRESSION: No active cardiopulmonary disease. Electronically Signed   By: Richarda Overlie M.D.   On: 08/31/2015 09:59   Ct Angio Chest Aorta W/cm &/or Wo/cm  08/31/2015  CLINICAL DATA:  Chest tightness.  Left breast and arm pain. EXAM: CT ANGIOGRAPHY CHEST, ABDOMEN AND PELVIS TECHNIQUE: Multidetector CT imaging through the chest, abdomen and pelvis was performed using the standard protocol during bolus administration of intravenous contrast. Multiplanar reconstructed images and MIPs were obtained and reviewed to evaluate the vascular anatomy. CONTRAST:  OMNIPAQUE IOHEXOL 350 MG/ML SOLN COMPARISON:  None. FINDINGS: CTA CHEST FINDINGS Clustered tree-in-bud nodular densities are noted in the superior segment of the right lower lobe. Left lung is clear. No pleural effusions. Heart is normal size. Aorta is normal caliber. No dissection. No visible coronary artery calcifications. No filling defects in the pulmonary arteries to suggest pulmonary emboli. No mediastinal, hilar, or axillary adenopathy. Chest wall soft tissues are unremarkable. Review of the MIP images confirms the above findings. CTA ABDOMEN AND PELVIS FINDINGS Aorta and iliofemoral vessels are normal caliber. No dissection. Mesenteric vessels and renal arteries are widely patent (2 renal arteries on the  left). Liver, gallbladder, spleen, pancreas, adrenals and left kidney are unremarkable. Small cyst which appears benign in the midpole of the right kidney. No hydronephrosis. Bowel grossly unremarkable.  No free fluid, free air, or adenopathy. Prior hysterectomy. No adnexal masses. Urinary bladder is unremarkable. No acute bony abnormality or focal bone lesion. Review of the MIP images confirms the above findings. IMPRESSION: No evidence of aortic aneurysm or dissection. No evidence of pulmonary embolus. Small area of clustered nodular densities in the superior segment of the right lower lobe could reflect small airways disease/ alveolitis. Electronically Signed   By: Charlett Nose M.D.   On: 08/31/2015 11:39   Ct Angio Abd/pel W/ And/or W/o  08/31/2015  CLINICAL DATA:  Chest tightness.  Left breast and arm pain. EXAM: CT ANGIOGRAPHY CHEST, ABDOMEN AND PELVIS TECHNIQUE: Multidetector CT imaging through the chest, abdomen and pelvis was performed using the standard protocol during bolus administration of intravenous contrast. Multiplanar reconstructed images and MIPs were obtained and reviewed to evaluate the vascular anatomy. CONTRAST:  OMNIPAQUE IOHEXOL 350 MG/ML SOLN COMPARISON:  None. FINDINGS: CTA CHEST FINDINGS Clustered tree-in-bud nodular densities are noted in the superior segment of the right lower lobe. Left lung is clear. No pleural effusions. Heart is normal size. Aorta is normal caliber. No dissection. No visible coronary artery calcifications. No filling defects in the pulmonary arteries to suggest pulmonary emboli. No mediastinal, hilar, or axillary adenopathy. Chest wall soft tissues are  unremarkable. Review of the MIP images confirms the above findings. CTA ABDOMEN AND PELVIS FINDINGS Aorta and iliofemoral vessels are normal caliber. No dissection. Mesenteric vessels and renal arteries are widely patent (2 renal arteries on the left). Liver, gallbladder, spleen, pancreas, adrenals and left  kidney are unremarkable. Small cyst which appears benign in the midpole of the right kidney. No hydronephrosis. Bowel grossly unremarkable.  No free fluid, free air, or adenopathy. Prior hysterectomy. No adnexal masses. Urinary bladder is unremarkable. No acute bony abnormality or focal bone lesion. Review of the MIP images confirms the above findings. IMPRESSION: No evidence of aortic aneurysm or dissection. No evidence of pulmonary embolus. Small area of clustered nodular densities in the superior segment of the right lower lobe could reflect small airways disease/ alveolitis. Electronically Signed   By: Charlett Nose M.D.   On: 08/31/2015 11:39    ASSESSMENT AND PLAN:  1. Chest pain, shortness of breath and fatigue: Both typical and atypical features of CAD. ECG unremarkable. Risk factors include HTN and hyperlipidemia. Echocardiogram demonstrated normal LV systolic function and regional wall motion with grade I diastolic dysfunction. Did have relief this morning with GI cocktail. I have recommended she commence ASA 81 mg daily until after stress test is obtained and results are reviewed. I will arrange for both stress testing and outpatient follow up.  2. Essential HTN: Controlled. No changes.  3. Hyperlipidemia: Lipids reviewed above. Continue Zetia and recommend daily exercise.    Signed: Prentice Docker, M.D., F.A.C.C.  09/01/2015, 2:28 PM

## 2015-09-04 ENCOUNTER — Ambulatory Visit (HOSPITAL_COMMUNITY)
Admission: RE | Admit: 2015-09-04 | Discharge: 2015-09-04 | Disposition: A | Discharge status: Home / Self Care | Payer: BLUE CROSS/BLUE SHIELD | Source: Ambulatory Visit | Attending: Cardiology | Admitting: Cardiology

## 2015-09-04 DIAGNOSIS — R0602 Shortness of breath: Secondary | ICD-10-CM | POA: Insufficient documentation

## 2015-09-04 DIAGNOSIS — R079 Chest pain, unspecified: Secondary | ICD-10-CM

## 2015-09-04 DIAGNOSIS — Z8249 Family history of ischemic heart disease and other diseases of the circulatory system: Secondary | ICD-10-CM | POA: Insufficient documentation

## 2015-09-04 DIAGNOSIS — R5383 Other fatigue: Secondary | ICD-10-CM | POA: Diagnosis not present

## 2015-09-04 DIAGNOSIS — R0609 Other forms of dyspnea: Secondary | ICD-10-CM | POA: Insufficient documentation

## 2015-09-04 DIAGNOSIS — I10 Essential (primary) hypertension: Secondary | ICD-10-CM | POA: Diagnosis not present

## 2015-09-04 LAB — MYOCARDIAL PERFUSION IMAGING
LV dias vol: 36 mL
LV sys vol: 9 mL
Peak HR: 99 {beats}/min
Rest HR: 63 {beats}/min
SDS: 4
SRS: 5
SSS: 9
TID: 1.26

## 2015-09-04 MED ORDER — AMINOPHYLLINE 25 MG/ML IV SOLN
75.0000 mg | Freq: Once | INTRAVENOUS | Status: AC
  Administered 2015-09-04: 75 mg via INTRAVENOUS

## 2015-09-04 MED ORDER — TECHNETIUM TC 99M SESTAMIBI GENERIC - CARDIOLITE
30.2000 | Freq: Once | INTRAVENOUS | Status: AC | PRN
  Administered 2015-09-04: 30.2 via INTRAVENOUS

## 2015-09-04 MED ORDER — REGADENOSON 0.4 MG/5ML IV SOLN
0.4000 mg | Freq: Once | INTRAVENOUS | Status: AC
  Administered 2015-09-04: 0.4 mg via INTRAVENOUS

## 2015-09-04 MED ORDER — TECHNETIUM TC 99M SESTAMIBI GENERIC - CARDIOLITE
10.6000 | Freq: Once | INTRAVENOUS | Status: AC | PRN
  Administered 2015-09-04: 10.6 via INTRAVENOUS

## 2015-09-09 NOTE — Progress Notes (Signed)
Cardiology Office Note   Date:  09/11/2015   ID:  WINNIE UMALI, DOB [birth year], MRN [id number]  PCP:  Lillia Mountain, MD  Cardiologist:  Dr. Eldridge Dace (seen > 3 years ago in consult) --> requests Dr. Katrinka Blazing (husband's doctor)   Post hospital follow up    History of Present Illness: Susan Gallegos is a 75 y.o. female with a history of HLD, HTN and GERD who presents to clinic for post hospital follow up after recent admission for chest pain.   She was admitted in 2011 for atypical chest pain. She ruled out and was sent for outpatient stress test, which returned normal.   Admitted again in 2013 for atypical chest pain. Seen by Dr. Eldridge Dace. She ruled out and was set up for outpatient nuclear stress test which again returned normal.   Readmitted 2/25-2/26017 for chest pain with both typical and atypical features. She awoke with chest pain associated with some shortness of breath and radiation of pain into left arm. Troponins, thoracic imaging, ECHO and ECG unremarkable. She had some relief with a GI cocktail. It was once again decided to proceed with discharge and outpatient stress test. She underwent lexiscan myoview on 3/81/17 which returned a low risk study with EF 74%.  Today she presents to clinic for follow up. She still does have some chest pain since discharge. It is a heaviness in her chest that radiates down her left breast and left arm. Last night it woke up her up from sleep. It is not worse with exertion. She started back walking around at the gym and she felt a little short winded but no chest pain. It also felt tight this AM. It comes and goes, but sometimes she has long periods of no symptoms at all. NTG does not usually help this. We discussed that the next test to do is a cardiac cath, but that it has risks. She would not like to do this at this point in time.  Her father did pass of a heart attack in his 80s. No other family members with CAD.                Past Medical  History  Diagnosis Date  . Elevated cholesterol   . Hypertension   . GERD (gastroesophageal reflux disease)   . Chest pain at rest 09/06/2012  . Arthritis     MILD IN HANDS "  . Osteopenia 06/2013    T score -1.1 FRAX 13%/2.5%    Past Surgical History  Procedure Laterality Date  . Vaginal hysterectomy       Current Outpatient Prescriptions  Medication Sig Dispense Refill  . aspirin EC 81 MG tablet Take 1 tablet (81 mg total) by mouth daily. 30 tablet 0  . Calcium Carbonate-Vitamin D (CALCIUM + D PO) Take by mouth 2 (two) times daily.      Marland Kitchen estradiol (CLIMARA - DOSED IN MG/24 HR) 0.0375 mg/24hr patch Place 1 patch (0.0375 mg total) onto the skin once a week. 4 patch 12  . ezetimibe (ZETIA) 10 MG tablet Take 10 mg by mouth daily.    . hydrochlorothiazide (HYDRODIURIL) 25 MG tablet Take 12.5 mg by mouth daily.     Marland Kitchen loratadine (CLARITIN) 10 MG tablet Take 10 mg by mouth daily.    . Multiple Vitamin (MULTIVITAMIN) tablet Take 1 tablet by mouth daily.     . nitroGLYCERIN (NITROSTAT) 0.4 MG SL tablet Place 1 tablet (0.4 mg total) under the tongue every  5 (five) minutes as needed for chest pain (CP or SOB). 30 tablet 0  . pantoprazole (PROTONIX) 40 MG tablet Take 40 mg by mouth 2 (two) times daily.     No current facility-administered medications for this visit.    Allergies:   Codeine    Social History:  The patient  reports that she has never smoked. She has never used smokeless tobacco. She reports that she does not drink alcohol or use illicit drugs.   Family History:  The patient's family history includes Diabetes in her brother, father, and sister; Heart disease in her father; Stroke in her sister; Uterine cancer in her mother.    ROS:  Please see the history of present illness.   Otherwise, review of systems are positive for NONE.   All other systems are reviewed and negative.    PHYSICAL EXAM: VS:  BP 140/68 mmHg  Pulse 82  Ht 5\' 1"  (1.549 m)  Wt 109 lb (49.442 kg)   BMI 20.61 kg/m2 , BMI Body mass index is 20.61 kg/(m^2). GEN: Well nourished, well developed, in no acute distress HEENT: normal Neck: no JVD, carotid bruits, or masses Cardiac: RRR; no murmurs, rubs, or gallops,no edema  Respiratory:  clear to auscultation bilaterally, normal work of breathing GI: soft, nontender, nondistended, + BS MS: no deformity or atrophy Skin: warm and dry, no rash Neuro:  Strength and sensation are intact Psych: euthymic mood, full affect   EKG:  EKG is ordered today. The ekg ordered today demonstrates NSR HR 80   Recent Labs: 08/31/2015: Hemoglobin 13.0; Platelets 250 09/01/2015: ALT 22; BUN 11; Creatinine, Ser 0.71; Magnesium 1.8; Potassium 4.4; Sodium 138    Lipid Panel    Component Value Date/Time   CHOL 201* 09/01/2015 0838   TRIG 194* 09/01/2015 0838   HDL 65 09/01/2015 0838   CHOLHDL 3.1 09/01/2015 0838   VLDL 39 09/01/2015 0838   LDLCALC 97 09/01/2015 0838      Wt Readings from Last 3 Encounters:  09/11/15 109 lb (49.442 kg)  09/04/15 106 lb (48.081 kg)  08/31/15 107 lb 12.8 oz (48.898 kg)      Other studies Reviewed: Additional studies/ records that were reviewed today include: 2D ECHO and nuclear stress test.  Review of the above records demonstrates:   2D ECHO: 09/01/2015 LV EF: 60% -  65% Study Conclusions - Left ventricle: The cavity size was normal. Systolic function was normal. The estimated ejection fraction was in the range of 60% to 65%. Wall motion was normal; there were no regional wall motion abnormalities. Doppler parameters are consistent with abnormal left ventricular relaxation (grade 1 diastolic dysfunction). - Aortic valve: Transvalvular velocity was within the normal range. There was no stenosis. - Mitral valve: Transvalvular velocity was within the normal range. There was no evidence for stenosis. There was mild regurgitation. - Right ventricle: The cavity size was normal. Wall thickness  was normal. Systolic function was normal. - Tricuspid valve: There was no regurgitation. - Inferior vena cava: The vessel was normal in size. The respirophasic diameter changes were in the normal range (>= 50%), consistent with normal central venous pressure.  Eugenie BirksLexiscan Myoview 09/04/15 Study Highlights     Nuclear stress EF: 74%.  The left ventricular ejection fraction is hyperdynamic (>65%).  There was no ST segment deviation noted during stress.  No T wave inversion was noted during stress.  The study is normal.  This is a low risk study.      ASSESSMENT AND  PLAN:  Susan Gallegos is a 75 y.o. female with a history of HLD, HTN and GERD who presents to clinic for post hospital follow up after recent admission for chest pain.   Recurrent chest pain: with 3 normal stress tests. Atypical chest pain.  -- Continue ASA. She doesn't think it is her heart, but just wanted to be seen in follow up. I provided reassurance today that all of her cardiac testing has returned normal. She will continue to follow with her PCP and see [place] PRN  HTN: well controlled on HCTZ 12.5mg  daily  HLD: lipid panel 08/31/25 with TC 201, TG 194, HDL 65, LDL 97. Continue Zetia.    Current medicines are reviewed at length with the patient today.  The patient does not have concerns regarding medicines.  The following changes have been made:  no change  Labs/ tests ordered today include:   Orders Placed This Encounter  Procedures  . EKG 12-Lead    Disposition:   FU with Dr. Katrinka Blazing PRN    Signed, Janetta Hora, PA-C  09/11/2015 8:18 AM    [facility] [address] [place], [place], [place]  [address] Phone: [phone]; Fax: [phone]

## 2015-09-11 ENCOUNTER — Encounter: Payer: Self-pay | Admitting: Physician Assistant

## 2015-09-11 ENCOUNTER — Ambulatory Visit (INDEPENDENT_AMBULATORY_CARE_PROVIDER_SITE_OTHER): Payer: BLUE CROSS/BLUE SHIELD | Admitting: Physician Assistant

## 2015-09-11 VITALS — BP 140/68 | HR 82 | Ht 61.0 in | Wt 109.0 lb

## 2015-09-11 DIAGNOSIS — R079 Chest pain, unspecified: Secondary | ICD-10-CM

## 2015-09-11 DIAGNOSIS — R0789 Other chest pain: Secondary | ICD-10-CM

## 2015-09-11 NOTE — Patient Instructions (Signed)
Medication Instructions:  Your physician recommends that you continue on your current medications as directed. Please refer to the Current Medication list given to you today.   Labwork: None ordered  Testing/Procedures: None ordered  Follow-Up: Your physician recommends that you schedule a follow-up appointment AS NEEDED WITH DR. Katrinka BlazingSMITH   Any Other Special Instructions Will Be Listed Below (If Applicable).    If you need a refill on your cardiac medications before your next appointment, please call your pharmacy.

## 2015-11-04 DIAGNOSIS — M8580 Other specified disorders of bone density and structure, unspecified site: Secondary | ICD-10-CM

## 2015-11-04 HISTORY — DX: Other specified disorders of bone density and structure, unspecified site: M85.80

## 2015-11-05 ENCOUNTER — Ambulatory Visit (INDEPENDENT_AMBULATORY_CARE_PROVIDER_SITE_OTHER): Payer: BLUE CROSS/BLUE SHIELD

## 2015-11-05 DIAGNOSIS — M8580 Other specified disorders of bone density and structure, unspecified site: Secondary | ICD-10-CM

## 2015-11-05 DIAGNOSIS — Z13820 Encounter for screening for osteoporosis: Secondary | ICD-10-CM

## 2015-11-05 DIAGNOSIS — M899 Disorder of bone, unspecified: Secondary | ICD-10-CM | POA: Diagnosis not present

## 2015-11-06 ENCOUNTER — Encounter: Payer: Self-pay | Admitting: Gynecology

## 2015-12-04 DIAGNOSIS — Z79899 Other long term (current) drug therapy: Secondary | ICD-10-CM | POA: Diagnosis not present

## 2015-12-04 DIAGNOSIS — R739 Hyperglycemia, unspecified: Secondary | ICD-10-CM | POA: Diagnosis not present

## 2015-12-04 DIAGNOSIS — Z0000 Encounter for general adult medical examination without abnormal findings: Secondary | ICD-10-CM | POA: Diagnosis not present

## 2015-12-04 DIAGNOSIS — E7800 Pure hypercholesterolemia, unspecified: Secondary | ICD-10-CM | POA: Diagnosis not present

## 2015-12-04 DIAGNOSIS — K219 Gastro-esophageal reflux disease without esophagitis: Secondary | ICD-10-CM | POA: Diagnosis not present

## 2015-12-04 DIAGNOSIS — I10 Essential (primary) hypertension: Secondary | ICD-10-CM | POA: Diagnosis not present

## 2015-12-20 DIAGNOSIS — H5203 Hypermetropia, bilateral: Secondary | ICD-10-CM | POA: Diagnosis not present

## 2016-01-02 DIAGNOSIS — L814 Other melanin hyperpigmentation: Secondary | ICD-10-CM | POA: Diagnosis not present

## 2016-01-02 DIAGNOSIS — L720 Epidermal cyst: Secondary | ICD-10-CM | POA: Diagnosis not present

## 2016-01-02 DIAGNOSIS — D2272 Melanocytic nevi of left lower limb, including hip: Secondary | ICD-10-CM | POA: Diagnosis not present

## 2016-01-02 DIAGNOSIS — D2271 Melanocytic nevi of right lower limb, including hip: Secondary | ICD-10-CM | POA: Diagnosis not present

## 2016-04-03 ENCOUNTER — Other Ambulatory Visit: Payer: Self-pay | Admitting: Internal Medicine

## 2016-04-03 DIAGNOSIS — Z1231 Encounter for screening mammogram for malignant neoplasm of breast: Secondary | ICD-10-CM

## 2016-04-08 DIAGNOSIS — Z23 Encounter for immunization: Secondary | ICD-10-CM | POA: Diagnosis not present

## 2016-04-20 ENCOUNTER — Ambulatory Visit
Admission: RE | Admit: 2016-04-20 | Discharge: 2016-04-20 | Disposition: A | Discharge status: Home / Self Care | Payer: BLUE CROSS/BLUE SHIELD | Source: Ambulatory Visit | Attending: Internal Medicine | Admitting: Internal Medicine

## 2016-04-20 DIAGNOSIS — Z1231 Encounter for screening mammogram for malignant neoplasm of breast: Secondary | ICD-10-CM

## 2016-04-21 ENCOUNTER — Other Ambulatory Visit: Payer: Self-pay | Admitting: Internal Medicine

## 2016-04-21 DIAGNOSIS — M7989 Other specified soft tissue disorders: Secondary | ICD-10-CM

## 2016-04-29 ENCOUNTER — Ambulatory Visit (INDEPENDENT_AMBULATORY_CARE_PROVIDER_SITE_OTHER): Payer: BLUE CROSS/BLUE SHIELD | Admitting: Women's Health

## 2016-04-29 ENCOUNTER — Encounter: Payer: Self-pay | Admitting: Women's Health

## 2016-04-29 VITALS — BP 135/70 | Ht 61.0 in | Wt 109.0 lb

## 2016-04-29 DIAGNOSIS — Z01419 Encounter for gynecological examination (general) (routine) without abnormal findings: Secondary | ICD-10-CM

## 2016-04-29 DIAGNOSIS — Z79890 Hormone replacement therapy (postmenopausal): Secondary | ICD-10-CM | POA: Diagnosis not present

## 2016-04-29 MED ORDER — ESTRADIOL 0.025 MG/24HR TD PTWK: 0.0250 mg | MEDICATED_PATCH | TRANSDERMAL | 4 refills | Status: DC

## 2016-04-29 NOTE — Patient Instructions (Signed)

## 2016-04-29 NOTE — Progress Notes (Signed)
Susan Gallegos 07/08/1940 [id number]    History:    Presents for annual exam.  1979 TVH for DUB on Climara 0.0375 patch has not been able to come off patch due to numerous hot flushes. Normal Pap and mammogram history. Not sexually active. 11/2015 T score -1.4 right femoral neck FRAX 9.4%/1.8%. Has had both Zostavax and Pneumovax. Declines colonoscopy. Primary care manages hypertension, hypercholesterolemia.  Past medical history, past surgical history, family history and social history were all reviewed and documented in the EPIC chart. 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 Has 2 children and 8 grandchildren all doing well. Also [place] [address] [place]  ROS:  A ROS was performed and pertinent positives and negatives are included.  Exam:  Vitals:   04/29/16 1003  BP: 135/70  Weight: 109 lb (49.4 kg)  Height: 5\' 1"  (1.549 m)   Body mass index is 20.6 kg/m.   General appearance:  Normal Thyroid:  Symmetrical, normal in size, without palpable masses or nodularity. Respiratory  Auscultation:  Clear without wheezing or rhonchi Cardiovascular  Auscultation:  Regular rate, without rubs, murmurs or gallops  Edema/varicosities:  Not grossly evident Abdominal  Soft,nontender, without masses, guarding or rebound.  Liver/spleen:  No organomegaly noted  Hernia:  None appreciated  Skin  Inspection:  Grossly normal   Breasts: Examined lying and sitting.     Right: Without masses, retractions, discharge or axillary adenopathy.     Left: Without masses, retractions, discharge or axillary adenopathy. Gentitourinary   Inguinal/mons:  Normal without inguinal adenopathy  External genitalia:  Normal  BUS/Urethra/Skene's glands:  Normal  Vagina:  Normal  Cervix:Uterus absent  Adnexa Rt: Without masses or tenderness.    Lt: Without masses or tenderness.  Anus and perineum: Normal  Digital rectal exam: Normal sphincter tone without palpated masses or tenderness  Assessment/Plan:   75 y.o. MWF G3 P2 for annual exam with no complaints.  1979 TVH on ERT Osteopenia without elevated FRAX Hypertension/GERD/hypercholesterolemia-primary care manages labs and meds  Plan: Women's health initiative, risks of blood clots, strokes and breast cancer reviewed with continued HRT, will try Climara 0.025 weekly patch. Instructed to call if hot flushes unbearable, reviewed if can stop this year would be best. SBE's, continue annual 3-D screening mammogram, calcium rich diet, vitamin D at primary care check. Continue healthy lifestyle, UHarrington Challenger.    Zarin Knupp J WHNP, 10:47 AM 04/29/2016

## 2016-05-01 ENCOUNTER — Ambulatory Visit
Admission: RE | Admit: 2016-05-01 | Discharge: 2016-05-01 | Disposition: A | Discharge status: Home / Self Care | Payer: BLUE CROSS/BLUE SHIELD | Source: Ambulatory Visit | Attending: Internal Medicine | Admitting: Internal Medicine

## 2016-05-01 DIAGNOSIS — M7989 Other specified soft tissue disorders: Secondary | ICD-10-CM

## 2016-05-01 DIAGNOSIS — R928 Other abnormal and inconclusive findings on diagnostic imaging of breast: Secondary | ICD-10-CM | POA: Diagnosis not present

## 2016-05-15 ENCOUNTER — Other Ambulatory Visit: Payer: Self-pay | Admitting: Women's Health

## 2016-05-15 DIAGNOSIS — Z79890 Hormone replacement therapy (postmenopausal): Secondary | ICD-10-CM

## 2016-06-11 DIAGNOSIS — E7800 Pure hypercholesterolemia, unspecified: Secondary | ICD-10-CM | POA: Diagnosis not present

## 2016-06-11 DIAGNOSIS — I10 Essential (primary) hypertension: Secondary | ICD-10-CM | POA: Diagnosis not present

## 2016-06-11 DIAGNOSIS — K219 Gastro-esophageal reflux disease without esophagitis: Secondary | ICD-10-CM | POA: Diagnosis not present

## 2016-06-11 DIAGNOSIS — R7301 Impaired fasting glucose: Secondary | ICD-10-CM | POA: Diagnosis not present

## 2016-09-03 DIAGNOSIS — R509 Fever, unspecified: Secondary | ICD-10-CM | POA: Diagnosis not present

## 2016-09-03 DIAGNOSIS — J0100 Acute maxillary sinusitis, unspecified: Secondary | ICD-10-CM | POA: Diagnosis not present

## 2016-09-03 DIAGNOSIS — J209 Acute bronchitis, unspecified: Secondary | ICD-10-CM | POA: Diagnosis not present

## 2016-12-03 DIAGNOSIS — R7301 Impaired fasting glucose: Secondary | ICD-10-CM | POA: Diagnosis not present

## 2016-12-03 DIAGNOSIS — I10 Essential (primary) hypertension: Secondary | ICD-10-CM | POA: Diagnosis not present

## 2016-12-03 DIAGNOSIS — E7800 Pure hypercholesterolemia, unspecified: Secondary | ICD-10-CM | POA: Diagnosis not present

## 2016-12-03 DIAGNOSIS — Z0000 Encounter for general adult medical examination without abnormal findings: Secondary | ICD-10-CM | POA: Diagnosis not present

## 2016-12-03 DIAGNOSIS — K219 Gastro-esophageal reflux disease without esophagitis: Secondary | ICD-10-CM | POA: Diagnosis not present

## 2017-01-12 DIAGNOSIS — E7800 Pure hypercholesterolemia, unspecified: Secondary | ICD-10-CM | POA: Diagnosis not present

## 2017-01-12 DIAGNOSIS — I10 Essential (primary) hypertension: Secondary | ICD-10-CM | POA: Diagnosis not present

## 2017-01-12 DIAGNOSIS — R109 Unspecified abdominal pain: Secondary | ICD-10-CM | POA: Diagnosis not present

## 2017-01-12 DIAGNOSIS — Z5181 Encounter for therapeutic drug level monitoring: Secondary | ICD-10-CM | POA: Diagnosis not present

## 2017-01-12 DIAGNOSIS — K219 Gastro-esophageal reflux disease without esophagitis: Secondary | ICD-10-CM | POA: Diagnosis not present

## 2017-02-03 DIAGNOSIS — L814 Other melanin hyperpigmentation: Secondary | ICD-10-CM | POA: Diagnosis not present

## 2017-02-03 DIAGNOSIS — L821 Other seborrheic keratosis: Secondary | ICD-10-CM | POA: Diagnosis not present

## 2017-02-03 DIAGNOSIS — D225 Melanocytic nevi of trunk: Secondary | ICD-10-CM | POA: Diagnosis not present

## 2017-02-03 DIAGNOSIS — L720 Epidermal cyst: Secondary | ICD-10-CM | POA: Diagnosis not present

## 2017-02-04 DIAGNOSIS — H35033 Hypertensive retinopathy, bilateral: Secondary | ICD-10-CM | POA: Diagnosis not present

## 2017-02-04 DIAGNOSIS — H5203 Hypermetropia, bilateral: Secondary | ICD-10-CM | POA: Diagnosis not present

## 2017-03-22 ENCOUNTER — Other Ambulatory Visit: Payer: Self-pay | Admitting: Internal Medicine

## 2017-03-22 DIAGNOSIS — Z1239 Encounter for other screening for malignant neoplasm of breast: Secondary | ICD-10-CM

## 2017-04-06 ENCOUNTER — Ambulatory Visit
Admission: RE | Admit: 2017-04-06 | Discharge: 2017-04-06 | Disposition: A | Discharge status: Home / Self Care | Payer: BLUE CROSS/BLUE SHIELD | Source: Ambulatory Visit | Attending: Internal Medicine | Admitting: Internal Medicine

## 2017-04-06 DIAGNOSIS — Z1231 Encounter for screening mammogram for malignant neoplasm of breast: Secondary | ICD-10-CM | POA: Diagnosis not present

## 2017-04-06 DIAGNOSIS — Z1239 Encounter for other screening for malignant neoplasm of breast: Secondary | ICD-10-CM

## 2017-04-13 DIAGNOSIS — Z23 Encounter for immunization: Secondary | ICD-10-CM | POA: Diagnosis not present

## 2017-04-30 ENCOUNTER — Encounter: Payer: BLUE CROSS/BLUE SHIELD | Admitting: Women's Health

## 2017-05-03 ENCOUNTER — Ambulatory Visit (INDEPENDENT_AMBULATORY_CARE_PROVIDER_SITE_OTHER): Payer: BLUE CROSS/BLUE SHIELD | Admitting: Women's Health

## 2017-05-03 ENCOUNTER — Encounter: Payer: Self-pay | Admitting: Women's Health

## 2017-05-03 VITALS — BP 128/80 | Ht 61.0 in | Wt 109.0 lb

## 2017-05-03 DIAGNOSIS — Z01419 Encounter for gynecological examination (general) (routine) without abnormal findings: Secondary | ICD-10-CM

## 2017-05-03 DIAGNOSIS — Z79890 Hormone replacement therapy (postmenopausal): Secondary | ICD-10-CM | POA: Diagnosis not present

## 2017-05-03 MED ORDER — ESTRADIOL 0.025 MG/24HR TD PTWK: 0.0250 mg | MEDICATED_PATCH | TRANSDERMAL | 4 refills | Status: DC

## 2017-05-03 NOTE — Patient Instructions (Signed)
Health Maintenance for Postmenopausal Women Menopause is a normal process in which your reproductive ability comes to an end. This process happens gradually over a span of months to years, usually between the ages of 22 and 9. Menopause is complete when you have missed 12 consecutive menstrual periods. It is important to talk with your health care provider about some of the most common conditions that affect postmenopausal women, such as heart disease, cancer, and bone loss (osteoporosis). Adopting a healthy lifestyle and getting preventive care can help to promote your health and wellness. Those actions can also lower your chances of developing some of these common conditions. What should I know about menopause? During menopause, you may experience a number of symptoms, such as:  Moderate-to-severe hot flashes.  Night sweats.  Decrease in sex drive.  Mood swings.  Headaches.  Tiredness.  Irritability.  Memory problems.  Insomnia.  Choosing to treat or not to treat menopausal changes is an individual decision that you make with your health care provider. What should I know about hormone replacement therapy and supplements? Hormone therapy products are effective for treating symptoms that are associated with menopause, such as hot flashes and night sweats. Hormone replacement carries certain risks, especially as you become older. If you are thinking about using estrogen or estrogen with progestin treatments, discuss the benefits and risks with your health care provider. What should I know about heart disease and stroke? Heart disease, heart attack, and stroke become more likely as you age. This may be due, in part, to the hormonal changes that your body experiences during menopause. These can affect how your body processes dietary fats, triglycerides, and cholesterol. Heart attack and stroke are both medical emergencies. There are many things that you can do to help prevent heart disease  and stroke:  Have your blood pressure checked at least every 1-2 years. High blood pressure causes heart disease and increases the risk of stroke.  If you are 53-22 years old, ask your health care provider if you should take aspirin to prevent a heart attack or a stroke.  Do not use any tobacco products, including cigarettes, chewing tobacco, or electronic cigarettes. If you need help quitting, ask your health care provider.  It is important to eat a healthy diet and maintain a healthy weight. ? Be sure to include plenty of vegetables, fruits, low-fat dairy products, and lean protein. ? Avoid eating foods that are high in solid fats, added sugars, or salt (sodium).  Get regular exercise. This is one of the most important things that you can do for your health. ? Try to exercise for at least 150 minutes each week. The type of exercise that you do should increase your heart rate and make you sweat. This is known as moderate-intensity exercise. ? Try to do strengthening exercises at least twice each week. Do these in addition to the moderate-intensity exercise.  Know your numbers.Ask your health care provider to check your cholesterol and your blood glucose. Continue to have your blood tested as directed by your health care provider.  What should I know about cancer screening? There are several types of cancer. Take the following steps to reduce your risk and to catch any cancer development as early as possible. Breast Cancer  Practice breast self-awareness. ? This means understanding how your breasts normally appear and feel. ? It also means doing regular breast self-exams. Let your health care provider know about any changes, no matter how small.  If you are 40  or older, have a clinician do a breast exam (clinical breast exam or CBE) every year. Depending on your age, family history, and medical history, it may be recommended that you also have a yearly breast X-ray (mammogram).  If you  have a family history of breast cancer, talk with your health care provider about genetic screening.  If you are at high risk for breast cancer, talk with your health care provider about having an MRI and a mammogram every year.  Breast cancer (BRCA) gene test is recommended for women who have family members with BRCA-related cancers. Results of the assessment will determine the need for genetic counseling and BRCA1 and for BRCA2 testing. BRCA-related cancers include these types: ? Breast. This occurs in males or females. ? Ovarian. ? Tubal. This may also be called fallopian tube cancer. ? Cancer of the abdominal or pelvic lining (peritoneal cancer). ? Prostate. ? Pancreatic.  Cervical, Uterine, and Ovarian Cancer Your health care provider may recommend that you be screened regularly for cancer of the pelvic organs. These include your ovaries, uterus, and vagina. This screening involves a pelvic exam, which includes checking for microscopic changes to the surface of your cervix (Pap test).  For women ages 21-65, health care providers may recommend a pelvic exam and a Pap test every three years. For women ages 79-65, they may recommend the Pap test and pelvic exam, combined with testing for human papilloma virus (HPV), every five years. Some types of HPV increase your risk of cervical cancer. Testing for HPV may also be done on women of any age who have unclear Pap test results.  Other health care providers may not recommend any screening for nonpregnant women who are considered low risk for pelvic cancer and have no symptoms. Ask your health care provider if a screening pelvic exam is right for you.  If you have had past treatment for cervical cancer or a condition that could lead to cancer, you need Pap tests and screening for cancer for at least 20 years after your treatment. If Pap tests have been discontinued for you, your risk factors (such as having a new sexual partner) need to be  reassessed to determine if you should start having screenings again. Some women have medical problems that increase the chance of getting cervical cancer. In these cases, your health care provider may recommend that you have screening and Pap tests more often.  If you have a family history of uterine cancer or ovarian cancer, talk with your health care provider about genetic screening.  If you have vaginal bleeding after reaching menopause, tell your health care provider.  There are currently no reliable tests available to screen for ovarian cancer.  Lung Cancer Lung cancer screening is recommended for adults 69-62 years old who are at high risk for lung cancer because of a history of smoking. A yearly low-dose CT scan of the lungs is recommended if you:  Currently smoke.  Have a history of at least 30 pack-years of smoking and you currently smoke or have quit within the past 15 years. A pack-year is smoking an average of one pack of cigarettes per day for one year.  Yearly screening should:  Continue until it has been 15 years since you quit.  Stop if you develop a health problem that would prevent you from having lung cancer treatment.  Colorectal Cancer  This type of cancer can be detected and can often be prevented.  Routine colorectal cancer screening usually begins at  age 42 and continues through age 45.  If you have risk factors for colon cancer, your health care provider may recommend that you be screened at an earlier age.  If you have a family history of colorectal cancer, talk with your health care provider about genetic screening.  Your health care provider may also recommend using home test kits to check for hidden blood in your stool.  A small camera at the end of a tube can be used to examine your colon directly (sigmoidoscopy or colonoscopy). This is done to check for the earliest forms of colorectal cancer.  Direct examination of the colon should be repeated every  5-10 years until age 71. However, if early forms of precancerous polyps or small growths are found or if you have a family history or genetic risk for colorectal cancer, you may need to be screened more often.  Skin Cancer  Check your skin from head to toe regularly.  Monitor any moles. Be sure to tell your health care provider: ? About any new moles or changes in moles, especially if there is a change in a mole's shape or color. ? If you have a mole that is larger than the size of a pencil eraser.  If any of your family members has a history of skin cancer, especially at a young age, talk with your health care provider about genetic screening.  Always use sunscreen. Apply sunscreen liberally and repeatedly throughout the day.  Whenever you are outside, protect yourself by wearing long sleeves, pants, a wide-brimmed hat, and sunglasses.  What should I know about osteoporosis? Osteoporosis is a condition in which bone destruction happens more quickly than new bone creation. After menopause, you may be at an increased risk for osteoporosis. To help prevent osteoporosis or the bone fractures that can happen because of osteoporosis, the following is recommended:  If you are 46-71 years old, get at least 1,000 mg of calcium and at least 600 mg of vitamin D per day.  If you are older than age 55 but younger than age 65, get at least 1,200 mg of calcium and at least 600 mg of vitamin D per day.  If you are older than age 54, get at least 1,200 mg of calcium and at least 800 mg of vitamin D per day.  Smoking and excessive alcohol intake increase the risk of osteoporosis. Eat foods that are rich in calcium and vitamin D, and do weight-bearing exercises several times each week as directed by your health care provider. What should I know about how menopause affects my mental health? Depression may occur at any age, but it is more common as you become older. Common symptoms of depression  include:  Low or sad mood.  Changes in sleep patterns.  Changes in appetite or eating patterns.  Feeling an overall lack of motivation or enjoyment of activities that you previously enjoyed.  Frequent crying spells.  Talk with your health care provider if you think that you are experiencing depression. What should I know about immunizations? It is important that you get and maintain your immunizations. These include:  Tetanus, diphtheria, and pertussis (Tdap) booster vaccine.  Influenza every year before the flu season begins.  Pneumonia vaccine.  Shingles vaccine.  Your health care provider may also recommend other immunizations. This information is not intended to replace advice given to you by your health care provider. Make sure you discuss any questions you have with your health care provider. Document Released: 08/14/2005  Document Revised: 01/10/2016 Document Reviewed: 03/26/2015 Elsevier Interactive Patient Education  2018 Elsevier Inc.  

## 2017-05-03 NOTE — Progress Notes (Signed)
Susan Gallegos [birth year] [id number]    History:    Presents for breast and pelvic exam. 1979 TVH for DUB on Climara patch.  Normal Pap and mammogram history. 2017 T score -1.4 at the femoral neck FRAX 9.4%/1.8%. Vaccines current. Has never had a colonoscopy declines. Primary care manages hypertension, elevated cholesterol and GERD. Has annual dermatology appointments.  Past medical history, past surgical history, family history and social history were all reviewed and documented in the EPIC chart. 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 [place]. 5 grandchildren.   ROS:  A ROS was performed and pertinent positives and negatives are included.  Exam:  Vitals:   05/03/17 0936  BP: 128/80  Weight: 109 lb (49.4 kg)  Height: 5\' 1"  (1.549 m)   Body mass index is 20.6 kg/m.   General appearance:  Normal Thyroid:  Symmetrical, normal in size, without palpable masses or nodularity. Respiratory  Auscultation:  Clear without wheezing or rhonchi Cardiovascular  Auscultation:  Regular rate, without rubs, murmurs or gallops  Edema/varicosities:  Not grossly evident Abdominal  Soft,nontender, without masses, guarding or rebound.  Liver/spleen:  No organomegaly noted  Hernia:  None appreciated  Skin  Inspection:  Grossly normal   Breasts: Examined lying and sitting.     Right: Without masses, retractions, discharge or axillary adenopathy.     Left: Without masses, retractions, discharge or axillary adenopathy. Gentitourinary   Inguinal/mons:  Normal without inguinal adenopathy  External genitalia:  Normal  BUS/Urethra/Skene's glands:  Normal  Vagina:  Normal  Cervix:   uterus absent  Adnexa/parametria:     Rt: Without masses or tenderness.   Lt: Without masses or tenderness.  Anus and perineum: Normal  Digital rectal exam: Normal sphincter tone without palpated masses or tenderness  Assessment/Plan:  76 y.o. M WF G2 P2 for breast and pelvic exam with no  complaints.  1979 TVH for DUB on Climara patch Osteopenia without elevated FRAX Hypertension, hypercholesterolemia, GERD primary care manages labs and meds  Plan: Estradiol patch 0.0275 prescription, proper use, risk for blood clots, strokes and breast cancer reviewed, report still has hot flushes and does not want to stop. Reviewed importance of increasing exercise, encouraged yoga, home safety, fall prevention and importance of weightbearing exercise reviewed. Screening colonoscopy reviewed importance, declines. SBE's, continue annual screening mammogram, vitamin D 2000 daily encouraged. Shingrex vaccine reviewed will get a primary care.  Harrington Challengerancy J Young [facility] Suit[facility] LLCWHNP, 10:24 AM 05/03/2017

## 2017-07-10 DIAGNOSIS — J209 Acute bronchitis, unspecified: Secondary | ICD-10-CM | POA: Diagnosis not present

## 2017-07-22 DIAGNOSIS — I7300 Raynaud's syndrome without gangrene: Secondary | ICD-10-CM | POA: Diagnosis not present

## 2017-07-22 DIAGNOSIS — I10 Essential (primary) hypertension: Secondary | ICD-10-CM | POA: Diagnosis not present

## 2017-07-22 DIAGNOSIS — K219 Gastro-esophageal reflux disease without esophagitis: Secondary | ICD-10-CM | POA: Diagnosis not present

## 2017-12-21 DIAGNOSIS — R7301 Impaired fasting glucose: Secondary | ICD-10-CM | POA: Diagnosis not present

## 2017-12-21 DIAGNOSIS — Z0000 Encounter for general adult medical examination without abnormal findings: Secondary | ICD-10-CM | POA: Diagnosis not present

## 2017-12-21 DIAGNOSIS — K219 Gastro-esophageal reflux disease without esophagitis: Secondary | ICD-10-CM | POA: Diagnosis not present

## 2017-12-21 DIAGNOSIS — I10 Essential (primary) hypertension: Secondary | ICD-10-CM | POA: Diagnosis not present

## 2017-12-24 DIAGNOSIS — H5203 Hypermetropia, bilateral: Secondary | ICD-10-CM | POA: Diagnosis not present

## 2018-03-01 ENCOUNTER — Other Ambulatory Visit: Payer: Self-pay | Admitting: Internal Medicine

## 2018-03-01 DIAGNOSIS — Z1231 Encounter for screening mammogram for malignant neoplasm of breast: Secondary | ICD-10-CM

## 2018-03-18 DIAGNOSIS — D1724 Benign lipomatous neoplasm of skin and subcutaneous tissue of left leg: Secondary | ICD-10-CM | POA: Diagnosis not present

## 2018-03-18 DIAGNOSIS — D2271 Melanocytic nevi of right lower limb, including hip: Secondary | ICD-10-CM | POA: Diagnosis not present

## 2018-03-18 DIAGNOSIS — L821 Other seborrheic keratosis: Secondary | ICD-10-CM | POA: Diagnosis not present

## 2018-03-18 DIAGNOSIS — L918 Other hypertrophic disorders of the skin: Secondary | ICD-10-CM | POA: Diagnosis not present

## 2018-03-18 DIAGNOSIS — L570 Actinic keratosis: Secondary | ICD-10-CM | POA: Diagnosis not present

## 2018-04-14 ENCOUNTER — Ambulatory Visit: Payer: BLUE CROSS/BLUE SHIELD

## 2018-04-21 ENCOUNTER — Ambulatory Visit
Admission: RE | Admit: 2018-04-21 | Discharge: 2018-04-21 | Disposition: A | Discharge status: Home / Self Care | Payer: BLUE CROSS/BLUE SHIELD | Source: Ambulatory Visit | Attending: Internal Medicine | Admitting: Internal Medicine

## 2018-04-21 DIAGNOSIS — Z1231 Encounter for screening mammogram for malignant neoplasm of breast: Secondary | ICD-10-CM

## 2018-04-21 DIAGNOSIS — Z23 Encounter for immunization: Secondary | ICD-10-CM | POA: Diagnosis not present

## 2018-05-04 ENCOUNTER — Encounter: Payer: Self-pay | Admitting: Women's Health

## 2018-05-04 ENCOUNTER — Ambulatory Visit: Payer: BLUE CROSS/BLUE SHIELD | Admitting: Women's Health

## 2018-05-04 VITALS — BP 128/80 | Ht 61.0 in | Wt 106.0 lb

## 2018-05-04 DIAGNOSIS — Z79890 Hormone replacement therapy (postmenopausal): Secondary | ICD-10-CM | POA: Diagnosis not present

## 2018-05-04 DIAGNOSIS — Z13820 Encounter for screening for osteoporosis: Secondary | ICD-10-CM

## 2018-05-04 DIAGNOSIS — Z01419 Encounter for gynecological examination (general) (routine) without abnormal findings: Secondary | ICD-10-CM | POA: Diagnosis not present

## 2018-05-04 MED ORDER — ESTRADIOL 0.025 MG/24HR TD PTWK: 0.0250 mg | MEDICATED_PATCH | TRANSDERMAL | 4 refills | Status: DC

## 2018-05-04 NOTE — Patient Instructions (Signed)
Health Maintenance for Postmenopausal Women Menopause is a normal process in which your reproductive ability comes to an end. This process happens gradually over a span of months to years, usually between the ages of 22 and 9. Menopause is complete when you have missed 12 consecutive menstrual periods. It is important to talk with your health care provider about some of the most common conditions that affect postmenopausal women, such as heart disease, cancer, and bone loss (osteoporosis). Adopting a healthy lifestyle and getting preventive care can help to promote your health and wellness. Those actions can also lower your chances of developing some of these common conditions. What should I know about menopause? During menopause, you may experience a number of symptoms, such as:  Moderate-to-severe hot flashes.  Night sweats.  Decrease in sex drive.  Mood swings.  Headaches.  Tiredness.  Irritability.  Memory problems.  Insomnia.  Choosing to treat or not to treat menopausal changes is an individual decision that you make with your health care provider. What should I know about hormone replacement therapy and supplements? Hormone therapy products are effective for treating symptoms that are associated with menopause, such as hot flashes and night sweats. Hormone replacement carries certain risks, especially as you become older. If you are thinking about using estrogen or estrogen with progestin treatments, discuss the benefits and risks with your health care provider. What should I know about heart disease and stroke? Heart disease, heart attack, and stroke become more likely as you age. This may be due, in part, to the hormonal changes that your body experiences during menopause. These can affect how your body processes dietary fats, triglycerides, and cholesterol. Heart attack and stroke are both medical emergencies. There are many things that you can do to help prevent heart disease  and stroke:  Have your blood pressure checked at least every 1-2 years. High blood pressure causes heart disease and increases the risk of stroke.  If you are 53-22 years old, ask your health care provider if you should take aspirin to prevent a heart attack or a stroke.  Do not use any tobacco products, including cigarettes, chewing tobacco, or electronic cigarettes. If you need help quitting, ask your health care provider.  It is important to eat a healthy diet and maintain a healthy weight. ? Be sure to include plenty of vegetables, fruits, low-fat dairy products, and lean protein. ? Avoid eating foods that are high in solid fats, added sugars, or salt (sodium).  Get regular exercise. This is one of the most important things that you can do for your health. ? Try to exercise for at least 150 minutes each week. The type of exercise that you do should increase your heart rate and make you sweat. This is known as moderate-intensity exercise. ? Try to do strengthening exercises at least twice each week. Do these in addition to the moderate-intensity exercise.  Know your numbers.Ask your health care provider to check your cholesterol and your blood glucose. Continue to have your blood tested as directed by your health care provider.  What should I know about cancer screening? There are several types of cancer. Take the following steps to reduce your risk and to catch any cancer development as early as possible. Breast Cancer  Practice breast self-awareness. ? This means understanding how your breasts normally appear and feel. ? It also means doing regular breast self-exams. Let your health care provider know about any changes, no matter how small.  If you are 40  or older, have a clinician do a breast exam (clinical breast exam or CBE) every year. Depending on your age, family history, and medical history, it may be recommended that you also have a yearly breast X-ray (mammogram).  If you  have a family history of breast cancer, talk with your health care provider about genetic screening.  If you are at high risk for breast cancer, talk with your health care provider about having an MRI and a mammogram every year.  Breast cancer (BRCA) gene test is recommended for women who have family members with BRCA-related cancers. Results of the assessment will determine the need for genetic counseling and BRCA1 and for BRCA2 testing. BRCA-related cancers include these types: ? Breast. This occurs in males or females. ? Ovarian. ? Tubal. This may also be called fallopian tube cancer. ? Cancer of the abdominal or pelvic lining (peritoneal cancer). ? Prostate. ? Pancreatic.  Cervical, Uterine, and Ovarian Cancer Your health care provider may recommend that you be screened regularly for cancer of the pelvic organs. These include your ovaries, uterus, and vagina. This screening involves a pelvic exam, which includes checking for microscopic changes to the surface of your cervix (Pap test).  For women ages 21-65, health care providers may recommend a pelvic exam and a Pap test every three years. For women ages 79-65, they may recommend the Pap test and pelvic exam, combined with testing for human papilloma virus (HPV), every five years. Some types of HPV increase your risk of cervical cancer. Testing for HPV may also be done on women of any age who have unclear Pap test results.  Other health care providers may not recommend any screening for nonpregnant women who are considered low risk for pelvic cancer and have no symptoms. Ask your health care provider if a screening pelvic exam is right for you.  If you have had past treatment for cervical cancer or a condition that could lead to cancer, you need Pap tests and screening for cancer for at least 20 years after your treatment. If Pap tests have been discontinued for you, your risk factors (such as having a new sexual partner) need to be  reassessed to determine if you should start having screenings again. Some women have medical problems that increase the chance of getting cervical cancer. In these cases, your health care provider may recommend that you have screening and Pap tests more often.  If you have a family history of uterine cancer or ovarian cancer, talk with your health care provider about genetic screening.  If you have vaginal bleeding after reaching menopause, tell your health care provider.  There are currently no reliable tests available to screen for ovarian cancer.  Lung Cancer Lung cancer screening is recommended for adults 69-62 years old who are at high risk for lung cancer because of a history of smoking. A yearly low-dose CT scan of the lungs is recommended if you:  Currently smoke.  Have a history of at least 30 pack-years of smoking and you currently smoke or have quit within the past 15 years. A pack-year is smoking an average of one pack of cigarettes per day for one year.  Yearly screening should:  Continue until it has been 15 years since you quit.  Stop if you develop a health problem that would prevent you from having lung cancer treatment.  Colorectal Cancer  This type of cancer can be detected and can often be prevented.  Routine colorectal cancer screening usually begins at  age 42 and continues through age 45.  If you have risk factors for colon cancer, your health care provider may recommend that you be screened at an earlier age.  If you have a family history of colorectal cancer, talk with your health care provider about genetic screening.  Your health care provider may also recommend using home test kits to check for hidden blood in your stool.  A small camera at the end of a tube can be used to examine your colon directly (sigmoidoscopy or colonoscopy). This is done to check for the earliest forms of colorectal cancer.  Direct examination of the colon should be repeated every  5-10 years until age 71. However, if early forms of precancerous polyps or small growths are found or if you have a family history or genetic risk for colorectal cancer, you may need to be screened more often.  Skin Cancer  Check your skin from head to toe regularly.  Monitor any moles. Be sure to tell your health care provider: ? About any new moles or changes in moles, especially if there is a change in a mole's shape or color. ? If you have a mole that is larger than the size of a pencil eraser.  If any of your family members has a history of skin cancer, especially at a young age, talk with your health care provider about genetic screening.  Always use sunscreen. Apply sunscreen liberally and repeatedly throughout the day.  Whenever you are outside, protect yourself by wearing long sleeves, pants, a wide-brimmed hat, and sunglasses.  What should I know about osteoporosis? Osteoporosis is a condition in which bone destruction happens more quickly than new bone creation. After menopause, you may be at an increased risk for osteoporosis. To help prevent osteoporosis or the bone fractures that can happen because of osteoporosis, the following is recommended:  If you are 46-71 years old, get at least 1,000 mg of calcium and at least 600 mg of vitamin D per day.  If you are older than age 55 but younger than age 65, get at least 1,200 mg of calcium and at least 600 mg of vitamin D per day.  If you are older than age 54, get at least 1,200 mg of calcium and at least 800 mg of vitamin D per day.  Smoking and excessive alcohol intake increase the risk of osteoporosis. Eat foods that are rich in calcium and vitamin D, and do weight-bearing exercises several times each week as directed by your health care provider. What should I know about how menopause affects my mental health? Depression may occur at any age, but it is more common as you become older. Common symptoms of depression  include:  Low or sad mood.  Changes in sleep patterns.  Changes in appetite or eating patterns.  Feeling an overall lack of motivation or enjoyment of activities that you previously enjoyed.  Frequent crying spells.  Talk with your health care provider if you think that you are experiencing depression. What should I know about immunizations? It is important that you get and maintain your immunizations. These include:  Tetanus, diphtheria, and pertussis (Tdap) booster vaccine.  Influenza every year before the flu season begins.  Pneumonia vaccine.  Shingles vaccine.  Your health care provider may also recommend other immunizations. This information is not intended to replace advice given to you by your health care provider. Make sure you discuss any questions you have with your health care provider. Document Released: 08/14/2005  Document Revised: 01/10/2016 Document Reviewed: 03/26/2015 Elsevier Interactive Patient Education  2018 Elsevier Inc.  

## 2018-05-04 NOTE — Progress Notes (Signed)
Susan Gallegos 01-Nov-1940 [id number]    History:    Presents for breast and pelvic exam.  1979 TVH for DU B on 0.025 Climara patch.  Normal Pap and mammogram history.  2017 T score -1.4 FRAX 9.4% / 1.8%.  Primary care manages hypertension, osteopenia and hypercholesteremia.  Vaccines current.  Never had colonoscopy but has had negative Cologuard's.   Past medical history, past surgical history, family history and social history were all reviewed and documented in the EPIC chart.  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  Has a home in [place].  2 children and 8 grandchildren.  Husband health problems A. fib.  ROS:  A ROS was performed and pertinent positives and negatives are included.  Exam:  Vitals:   05/04/18 1033  Weight: 106 lb (48.1 kg)  Height: 5\' 1"  (1.549 m)   Body mass index is 20.03 kg/m.   General appearance:  Normal Thyroid:  Symmetrical, normal in size, without palpable masses or nodularity. Respiratory  Auscultation:  Clear without wheezing or rhonchi Cardiovascular  Auscultation:  Regular rate, without rubs, murmurs or gallops  Edema/varicosities:  Not grossly evident Abdominal  Soft,nontender, without masses, guarding or rebound.  Liver/spleen:  No organomegaly noted  Hernia:  None appreciated  Skin  Inspection:  Grossly normal   Breasts: Examined lying and sitting.     Right: Without masses, retractions, discharge or axillary adenopathy.     Left: Without masses, retractions, discharge or axillary adenopathy. Gentitourinary   Inguinal/mons:  Normal without inguinal adenopathy  External genitalia:  Normal  BUS/Urethra/Skene's glands:  Normal  Vagina:  Normal  Cervix: And uterus absent  Adnexa/parametria:     Rt: Without masses or tenderness.   Lt: Without masses or tenderness.  Anus and perineum: Normal  Digital rectal exam: Normal sphincter tone without palpated masses or tenderness  Assessment/Plan:  77 y.o. MWF G3 P2 for annual exam  with no complaints.  1979 TVH for DU B on ERT with manageable hot flushes Hypertension, hypercholesteremia-primary care manages labs and meds Current on vaccines Osteopenia without elevated FRAX  Plan: Climara 0.025 patch weekly prescription, proper use given and reviewed risks of blood clots, strokes and breast cancer, states continues with hot flushes but are tolerable on patch.  Reviewed it is low dose and lower risk.  SBE's, continue annual 3D screening mammogram, calcium rich foods, vitamin D 2000 daily encouraged.  Home safety, fall prevention and importance of weightbearing and balance type exercise reviewed and encouraged.  Repeat DEXA, will schedule.   Harrington Challenger [facility], 10:35 AM 05/04/2018

## 2018-05-09 DIAGNOSIS — J069 Acute upper respiratory infection, unspecified: Secondary | ICD-10-CM | POA: Diagnosis not present

## 2018-09-04 ENCOUNTER — Other Ambulatory Visit: Payer: Self-pay | Admitting: Women's Health

## 2018-09-04 DIAGNOSIS — Z79890 Hormone replacement therapy (postmenopausal): Secondary | ICD-10-CM

## 2018-12-28 DIAGNOSIS — H25813 Combined forms of age-related cataract, bilateral: Secondary | ICD-10-CM | POA: Diagnosis not present

## 2018-12-29 DIAGNOSIS — R7301 Impaired fasting glucose: Secondary | ICD-10-CM | POA: Diagnosis not present

## 2018-12-29 DIAGNOSIS — I10 Essential (primary) hypertension: Secondary | ICD-10-CM | POA: Diagnosis not present

## 2018-12-29 DIAGNOSIS — Z0000 Encounter for general adult medical examination without abnormal findings: Secondary | ICD-10-CM | POA: Diagnosis not present

## 2019-03-21 ENCOUNTER — Other Ambulatory Visit: Payer: Self-pay | Admitting: Internal Medicine

## 2019-03-21 DIAGNOSIS — Z1231 Encounter for screening mammogram for malignant neoplasm of breast: Secondary | ICD-10-CM

## 2019-04-13 ENCOUNTER — Encounter: Payer: Self-pay | Admitting: Gynecology

## 2019-05-05 ENCOUNTER — Other Ambulatory Visit: Payer: Self-pay

## 2019-05-05 ENCOUNTER — Ambulatory Visit
Admission: RE | Admit: 2019-05-05 | Discharge: 2019-05-05 | Disposition: A | Discharge status: Home / Self Care | Payer: BC Managed Care – PPO | Source: Ambulatory Visit | Attending: Internal Medicine | Admitting: Internal Medicine

## 2019-05-05 DIAGNOSIS — Z1231 Encounter for screening mammogram for malignant neoplasm of breast: Secondary | ICD-10-CM

## 2019-05-08 ENCOUNTER — Ambulatory Visit: Payer: BC Managed Care – PPO | Admitting: Women's Health

## 2019-05-08 ENCOUNTER — Encounter: Payer: Self-pay | Admitting: Women's Health

## 2019-05-08 ENCOUNTER — Other Ambulatory Visit: Payer: Self-pay

## 2019-05-08 VITALS — BP 122/80 | Ht 61.0 in | Wt 104.0 lb

## 2019-05-08 DIAGNOSIS — Z01419 Encounter for gynecological examination (general) (routine) without abnormal findings: Secondary | ICD-10-CM

## 2019-05-08 DIAGNOSIS — Z79890 Hormone replacement therapy (postmenopausal): Secondary | ICD-10-CM | POA: Diagnosis not present

## 2019-05-08 MED ORDER — ESTRADIOL 0.025 MG/24HR TD PTWK: MEDICATED_PATCH | TRANSDERMAL | 4 refills | Status: DC

## 2019-05-08 NOTE — Progress Notes (Signed)
Susan Gallegos [birth year] [id number]    History:    Presents for breast and pelvic exam with no complaints.  Climara patch 0.025 weekly continues with some hot flushes but manageable.  1979 TVH for DU B.  Normal Pap and mammogram history.  2017 T score -1.4 FRAX 9.4% / 1.8%.  Primary care manages hypertension, hypercholesteremia.  Has never had a colonoscopy, declines has had negative Cologuard's.  Not sexually active/husband's health.  Has annual skin check  Past medical history, past surgical history, family history and social history were all reviewed and documented in the EPIC chart.  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  2 children and 5 grandchildren all doing well.  ROS:  A ROS was performed and pertinent positives and negatives are included.  Exam:  Vitals:   05/08/19 1031  BP: 122/80  Weight: 104 lb (47.2 kg)  Height: 5\' 1"  (1.549 m)   Body mass index is 19.65 kg/m.   General appearance:  Normal Thyroid:  Symmetrical, normal in size, without palpable masses or nodularity. Respiratory  Auscultation:  Clear without wheezing or rhonchi Cardiovascular  Auscultation:  Regular rate, without rubs, murmurs or gallops  Edema/varicosities:  Not grossly evident Abdominal  Soft,nontender, without masses, guarding or rebound.  Liver/spleen:  No organomegaly noted  Hernia:  None appreciated  Skin  Inspection:  Grossly normal   Breasts: Examined lying and sitting. Pendulous    Right: Without masses, retractions, discharge or axillary adenopathy.     Left: Without masses, retractions, discharge or axillary adenopathy. Gentitourinary   Inguinal/mons:  Normal without inguinal adenopathy  External genitalia:  Normal  BUS/Urethra/Skene's glands:  Normal  Vagina:  Normal  Cervix: And uterus absent   Adnexa/parametria:     Rt: Without masses or tenderness.   Lt: Without masses or tenderness.  Anus and perineum: Normal  Digital rectal exam: Normal sphincter tone  without palpated masses or tenderness  Assessment/Plan:  78 y.o. MWF G3 P2 for breast and pelvic exam with no complaints.  1979 TVH for DU B on Climara patch/persistent hot flushes Osteopenia without elevated FRAX Hypertension, hypercholesteremia-primary care manages labs and meds  Plan: HRT risks of blood clots, strokes and breast cancer reviewed, states has poor sleep due to hot flashes when has tried to come off, will continue Climara 0.025 patch weekly, prescription given.  SBEs, continue annual 3D screening mammogram, calcium rich foods, vitamin D 2000 daily encouraged.  Repeat DEXA, will schedule.  Home safety, fall prevention and importance of weightbearing and balance type exercise, yoga encouraged.      [place], 11:01 AM 05/08/2019

## 2019-05-08 NOTE — Patient Instructions (Signed)
Health Maintenance After Age 77 After age 77, you are at a higher risk for certain long-term diseases and infections as well as injuries from falls. Falls are a major cause of broken bones and head injuries in people who are older than age 77. Getting regular preventive care can help to keep you healthy and well. Preventive care includes getting regular testing and making lifestyle changes as recommended by your health care provider. Talk with your health care provider about:  Which screenings and tests you should have. A screening is a test that checks for a disease when you have no symptoms.  A diet and exercise plan that is right for you. What should I know about screenings and tests to prevent falls? Screening and testing are the best ways to find a health problem early. Early diagnosis and treatment give you the best chance of managing medical conditions that are common after age 77. Certain conditions and lifestyle choices may make you more likely to have a fall. Your health care provider may recommend:  Regular vision checks. Poor vision and conditions such as cataracts can make you more likely to have a fall. If you wear glasses, make sure to get your prescription updated if your vision changes.  Medicine review. Work with your health care provider to regularly review all of the medicines you are taking, including over-the-counter medicines. Ask your health care provider about any side effects that may make you more likely to have a fall. Tell your health care provider if any medicines that you take make you feel dizzy or sleepy.  Osteoporosis screening. Osteoporosis is a condition that causes the bones to get weaker. This can make the bones weak and cause them to break more easily.  Blood pressure screening. Blood pressure changes and medicines to control blood pressure can make you feel dizzy.  Strength and balance checks. Your health care provider may recommend certain tests to check your  strength and balance while standing, walking, or changing positions.  Foot health exam. Foot pain and numbness, as well as not wearing proper footwear, can make you more likely to have a fall.  Depression screening. You may be more likely to have a fall if you have a fear of falling, feel emotionally low, or feel unable to do activities that you used to do.  Alcohol use screening. Using too much alcohol can affect your balance and may make you more likely to have a fall. What actions can I take to lower my risk of falls? General instructions  Talk with your health care provider about your risks for falling. Tell your health care provider if: ? You fall. Be sure to tell your health care provider about all falls, even ones that seem minor. ? You feel dizzy, sleepy, or off-balance.  Take over-the-counter and prescription medicines only as told by your health care provider. These include any supplements.  Eat a healthy diet and maintain a healthy weight. A healthy diet includes low-fat dairy products, low-fat (lean) meats, and fiber from whole grains, beans, and lots of fruits and vegetables. Home safety  Remove any tripping hazards, such as rugs, cords, and clutter.  Install safety equipment such as grab bars in bathrooms and safety rails on stairs.  Keep rooms and walkways well-lit. Activity   Follow a regular exercise program to stay fit. This will help you maintain your balance. Ask your health care provider what types of exercise are appropriate for you.  If you need a cane or   walker, use it as recommended by your health care provider.  Wear supportive shoes that have nonskid soles. Lifestyle  Do not drink alcohol if your health care provider tells you not to drink.  If you drink alcohol, limit how much you have: ? 0-1 drink a day for women. ? 0-2 drinks a day for men.  Be aware of how much alcohol is in your drink. In the [place] one drink equals one typical bottle of beer (12  oz), one-half glass of wine (5 oz), or one shot of hard liquor (1 oz).  Do not use any products that contain nicotine or tobacco, such as cigarettes and e-cigarettes. If you need help quitting, ask your health care provider. Summary  Having a healthy lifestyle and getting preventive care can help to protect your health and wellness after age 77.  Screening and testing are the best way to find a health problem early and help you avoid having a fall. Early diagnosis and treatment give you the best chance for managing medical conditions that are more common for people who are older than age 77.  Falls are a major cause of broken bones and head injuries in people who are older than age 77. Take precautions to prevent a fall at home.  Work with your health care provider to learn what changes you can make to improve your health and wellness and to prevent falls. This information is not intended to replace advice given to you by your health care provider. Make sure you discuss any questions you have with your health care provider. Document Released: 05/05/2017 Document Revised: 10/13/2018 Document Reviewed: 05/05/2017 Elsevier Patient Education  2020 Elsevier Inc.  

## 2019-05-09 ENCOUNTER — Other Ambulatory Visit: Payer: Self-pay | Admitting: Internal Medicine

## 2019-05-09 DIAGNOSIS — R928 Other abnormal and inconclusive findings on diagnostic imaging of breast: Secondary | ICD-10-CM

## 2019-05-15 ENCOUNTER — Other Ambulatory Visit: Payer: BC Managed Care – PPO

## 2019-05-16 ENCOUNTER — Ambulatory Visit
Admission: RE | Admit: 2019-05-16 | Discharge: 2019-05-16 | Disposition: A | Discharge status: Home / Self Care | Payer: BC Managed Care – PPO | Source: Ambulatory Visit | Attending: Internal Medicine | Admitting: Internal Medicine

## 2019-05-16 ENCOUNTER — Ambulatory Visit: Payer: BC Managed Care – PPO

## 2019-05-16 ENCOUNTER — Other Ambulatory Visit: Payer: Self-pay

## 2019-05-16 ENCOUNTER — Other Ambulatory Visit: Payer: Self-pay | Admitting: Internal Medicine

## 2019-05-16 DIAGNOSIS — R928 Other abnormal and inconclusive findings on diagnostic imaging of breast: Secondary | ICD-10-CM

## 2019-05-16 DIAGNOSIS — N6321 Unspecified lump in the left breast, upper outer quadrant: Secondary | ICD-10-CM | POA: Diagnosis not present

## 2019-05-16 DIAGNOSIS — N6323 Unspecified lump in the left breast, lower outer quadrant: Secondary | ICD-10-CM | POA: Diagnosis not present

## 2019-05-17 ENCOUNTER — Other Ambulatory Visit: Payer: Self-pay

## 2019-05-17 ENCOUNTER — Other Ambulatory Visit: Payer: Self-pay | Admitting: *Deleted

## 2019-05-17 DIAGNOSIS — Z780 Asymptomatic menopausal state: Secondary | ICD-10-CM

## 2019-05-17 DIAGNOSIS — M8589 Other specified disorders of bone density and structure, multiple sites: Secondary | ICD-10-CM

## 2019-05-17 NOTE — Progress Notes (Signed)
Bone density order entered [employer]

## 2019-06-07 DIAGNOSIS — I10 Essential (primary) hypertension: Secondary | ICD-10-CM | POA: Diagnosis not present

## 2019-06-12 ENCOUNTER — Other Ambulatory Visit: Payer: Self-pay

## 2019-06-13 ENCOUNTER — Encounter: Payer: Self-pay | Admitting: Gynecology

## 2019-06-13 ENCOUNTER — Ambulatory Visit (INDEPENDENT_AMBULATORY_CARE_PROVIDER_SITE_OTHER): Payer: BC Managed Care – PPO

## 2019-06-13 ENCOUNTER — Other Ambulatory Visit: Payer: Self-pay | Admitting: Women's Health

## 2019-06-13 DIAGNOSIS — Z780 Asymptomatic menopausal state: Secondary | ICD-10-CM

## 2019-06-13 DIAGNOSIS — M8589 Other specified disorders of bone density and structure, multiple sites: Secondary | ICD-10-CM

## 2019-06-13 DIAGNOSIS — D225 Melanocytic nevi of trunk: Secondary | ICD-10-CM | POA: Diagnosis not present

## 2019-06-13 DIAGNOSIS — L814 Other melanin hyperpigmentation: Secondary | ICD-10-CM | POA: Diagnosis not present

## 2019-06-13 DIAGNOSIS — D2272 Melanocytic nevi of left lower limb, including hip: Secondary | ICD-10-CM | POA: Diagnosis not present

## 2019-06-13 DIAGNOSIS — L570 Actinic keratosis: Secondary | ICD-10-CM | POA: Diagnosis not present

## 2019-06-13 DIAGNOSIS — D2271 Melanocytic nevi of right lower limb, including hip: Secondary | ICD-10-CM | POA: Diagnosis not present

## 2019-06-30 ENCOUNTER — Other Ambulatory Visit: Payer: Self-pay | Admitting: Women's Health

## 2019-06-30 DIAGNOSIS — Z79890 Hormone replacement therapy (postmenopausal): Secondary | ICD-10-CM

## 2019-07-27 ENCOUNTER — Ambulatory Visit: Payer: BC Managed Care – PPO | Attending: Internal Medicine

## 2019-07-27 DIAGNOSIS — Z23 Encounter for immunization: Secondary | ICD-10-CM | POA: Diagnosis not present

## 2019-07-27 NOTE — Progress Notes (Signed)
   Covid-19 Vaccination Clinic  Name:  Fontaine Kossman    MRN: [id number] DOB: 06-May-1941  07/27/2019  Ms. Goya was observed post Covid-19 immunization for 15 minutes without incidence. She was provided with Vaccine Information Sheet and instruction to access the V-Safe system.   Ms. Fouty was instructed to call 911 with any severe reactions post vaccine: Marland Kitchen Difficulty breathing  . Swelling of your face and throat  . A fast heartbeat  . A bad rash all over your body  . Dizziness and weakness    Immunizations Administered    Name Date Dose VIS Date Route   Pfizer COVID-19 Vaccine 07/27/2019  5:58 PM 0.3 mL 06/16/2019 Intramuscular   Manufacturer: [employer], [employer]   Lot: [id number]   NDC: [address]-5

## 2019-08-17 ENCOUNTER — Ambulatory Visit: Payer: BC Managed Care – PPO | Attending: Internal Medicine

## 2019-08-17 DIAGNOSIS — Z23 Encounter for immunization: Secondary | ICD-10-CM | POA: Insufficient documentation

## 2019-08-17 NOTE — Progress Notes (Signed)
   Covid-19 Vaccination Clinic  Name:  Susan Gallegos    MRN: [id number] DOB: [birth year]  08/17/2019  [name] was observed post Covid-19 immunization for 15 minutes without incidence. She was provided with Vaccine Information Sheet and instruction to access the V-Safe system.   [name] was instructed to call 911 with any severe reactions post vaccine: Marland Kitchen Difficulty breathing  . Swelling of your face and throat  . A fast heartbeat  . A bad rash all over your body  . Dizziness and weakness    Immunizations Administered    Name Date Dose VIS Date Route   Pfizer COVID-19 Vaccine 08/17/2019  9:35 AM 0.3 mL 06/16/2019 Intramuscular   Manufacturer: [employer], [employer]   Lot: [id number]   NDC: [address]-6

## 2019-11-20 ENCOUNTER — Ambulatory Visit
Admission: RE | Admit: 2019-11-20 | Discharge: 2019-11-20 | Disposition: A | Discharge status: Home / Self Care | Payer: BC Managed Care – PPO | Source: Ambulatory Visit | Attending: Internal Medicine | Admitting: Internal Medicine

## 2019-11-20 ENCOUNTER — Other Ambulatory Visit: Payer: Self-pay

## 2019-11-20 ENCOUNTER — Other Ambulatory Visit: Payer: Self-pay | Admitting: Internal Medicine

## 2019-11-20 DIAGNOSIS — R928 Other abnormal and inconclusive findings on diagnostic imaging of breast: Secondary | ICD-10-CM

## 2019-11-20 DIAGNOSIS — N6012 Diffuse cystic mastopathy of left breast: Secondary | ICD-10-CM | POA: Diagnosis not present

## 2019-11-20 DIAGNOSIS — N6320 Unspecified lump in the left breast, unspecified quadrant: Secondary | ICD-10-CM

## 2020-01-30 DIAGNOSIS — R7301 Impaired fasting glucose: Secondary | ICD-10-CM | POA: Diagnosis not present

## 2020-01-30 DIAGNOSIS — K219 Gastro-esophageal reflux disease without esophagitis: Secondary | ICD-10-CM | POA: Diagnosis not present

## 2020-01-30 DIAGNOSIS — Z23 Encounter for immunization: Secondary | ICD-10-CM | POA: Diagnosis not present

## 2020-01-30 DIAGNOSIS — Z0000 Encounter for general adult medical examination without abnormal findings: Secondary | ICD-10-CM | POA: Diagnosis not present

## 2020-01-30 DIAGNOSIS — I10 Essential (primary) hypertension: Secondary | ICD-10-CM | POA: Diagnosis not present

## 2020-05-08 ENCOUNTER — Encounter: Payer: BC Managed Care – PPO | Admitting: Nurse Practitioner

## 2020-05-23 ENCOUNTER — Ambulatory Visit
Admission: RE | Admit: 2020-05-23 | Discharge: 2020-05-23 | Disposition: A | Discharge status: Home / Self Care | Payer: BC Managed Care – PPO | Source: Ambulatory Visit | Attending: Internal Medicine | Admitting: Internal Medicine

## 2020-05-23 ENCOUNTER — Other Ambulatory Visit: Payer: Self-pay | Admitting: Internal Medicine

## 2020-05-23 ENCOUNTER — Other Ambulatory Visit: Payer: Self-pay

## 2020-05-23 DIAGNOSIS — N6489 Other specified disorders of breast: Secondary | ICD-10-CM | POA: Diagnosis not present

## 2020-05-23 DIAGNOSIS — N6002 Solitary cyst of left breast: Secondary | ICD-10-CM | POA: Diagnosis not present

## 2020-05-23 DIAGNOSIS — N6320 Unspecified lump in the left breast, unspecified quadrant: Secondary | ICD-10-CM

## 2020-05-23 DIAGNOSIS — R928 Other abnormal and inconclusive findings on diagnostic imaging of breast: Secondary | ICD-10-CM | POA: Diagnosis not present

## 2020-07-15 DIAGNOSIS — L821 Other seborrheic keratosis: Secondary | ICD-10-CM | POA: Diagnosis not present

## 2020-07-15 DIAGNOSIS — D1801 Hemangioma of skin and subcutaneous tissue: Secondary | ICD-10-CM | POA: Diagnosis not present

## 2020-07-15 DIAGNOSIS — L820 Inflamed seborrheic keratosis: Secondary | ICD-10-CM | POA: Diagnosis not present

## 2020-07-15 DIAGNOSIS — H61001 Unspecified perichondritis of right external ear: Secondary | ICD-10-CM | POA: Diagnosis not present

## 2020-07-15 DIAGNOSIS — L718 Other rosacea: Secondary | ICD-10-CM | POA: Diagnosis not present

## 2020-08-02 DIAGNOSIS — Z23 Encounter for immunization: Secondary | ICD-10-CM | POA: Diagnosis not present

## 2020-08-02 DIAGNOSIS — M79605 Pain in left leg: Secondary | ICD-10-CM | POA: Diagnosis not present

## 2020-08-02 DIAGNOSIS — I10 Essential (primary) hypertension: Secondary | ICD-10-CM | POA: Diagnosis not present

## 2020-08-02 DIAGNOSIS — M79604 Pain in right leg: Secondary | ICD-10-CM | POA: Diagnosis not present

## 2020-11-27 IMAGING — US US BREAST*R* LIMITED INC AXILLA
1 series · 6 of 6 positions shown · non-contrast
Comparison: Previous exam(s).

CLINICAL DATA: Patient returns today to evaluate a probably benign
fibrocystic change in the outer LEFT breast.

EXAM:
DIGITAL DIAGNOSTIC BILATERAL MAMMOGRAM WITH CAD AND TOMO
ULTRASOUND BILATERAL BREAST

[Series 1: us breast*right* limited inc axilla · 0.06mm/px · 6 of 6 slices shown]
[im 1/6]
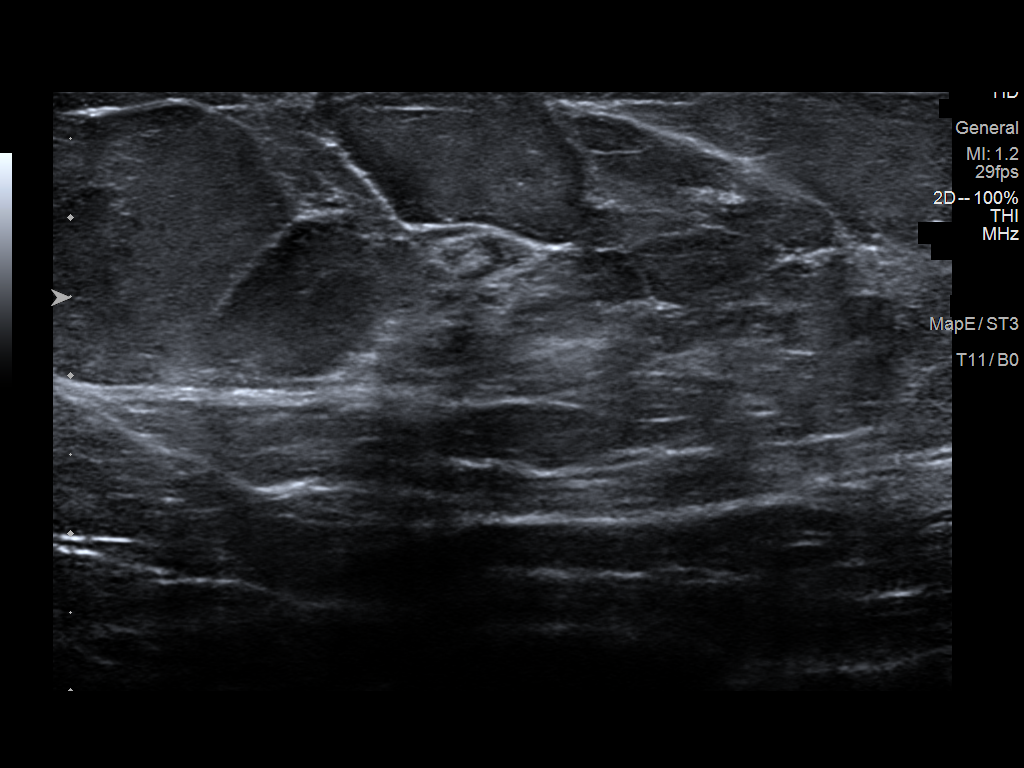
[im 2/6]
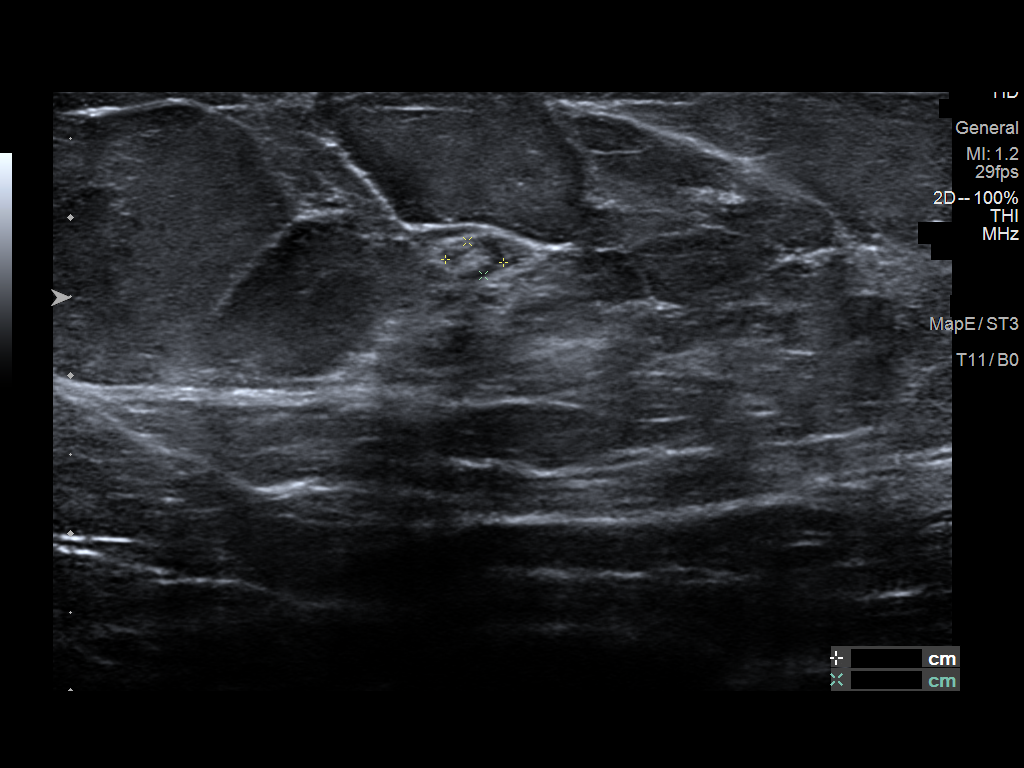
[im 3/6]
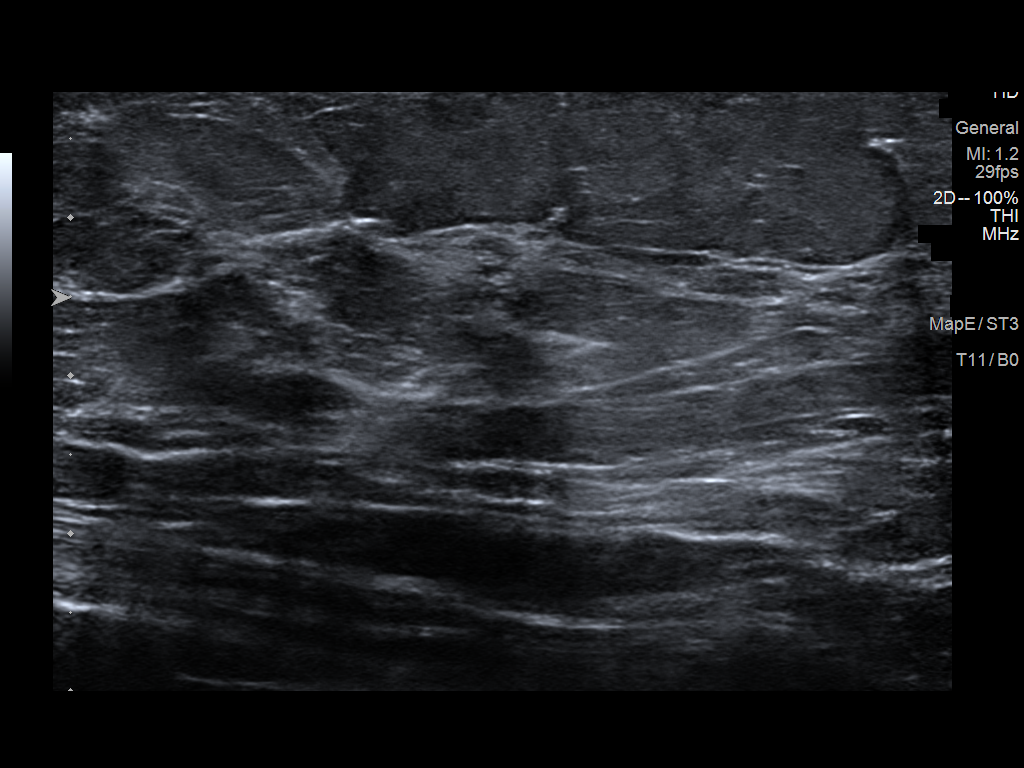
[im 4/6]
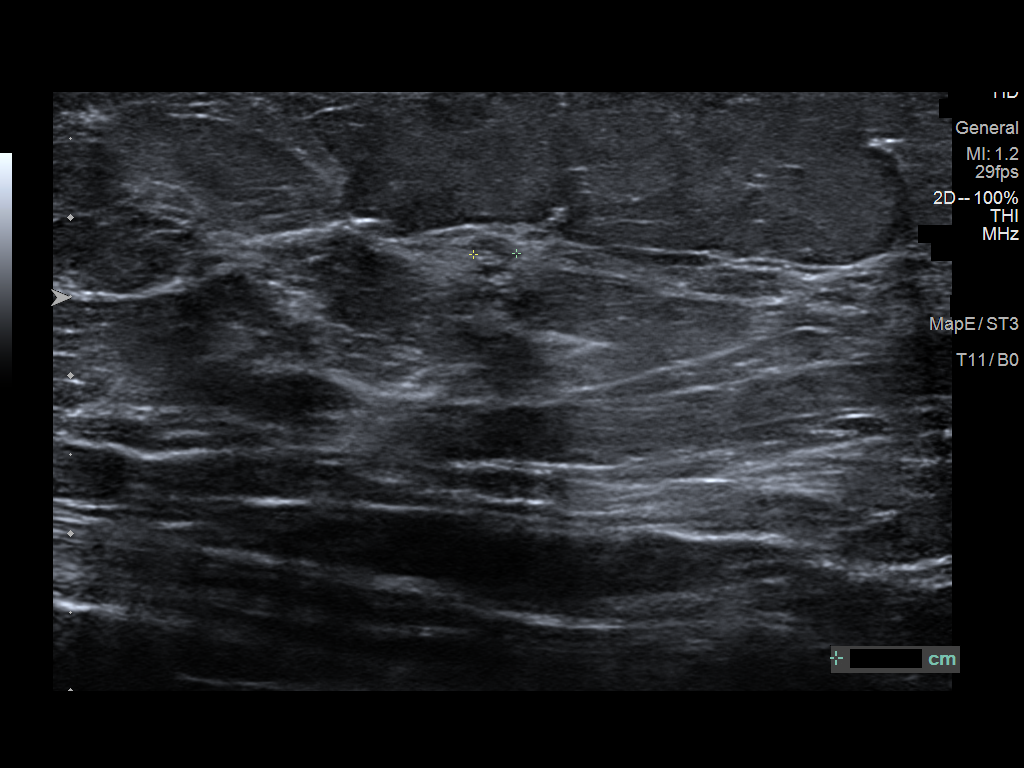
[im 5/6]
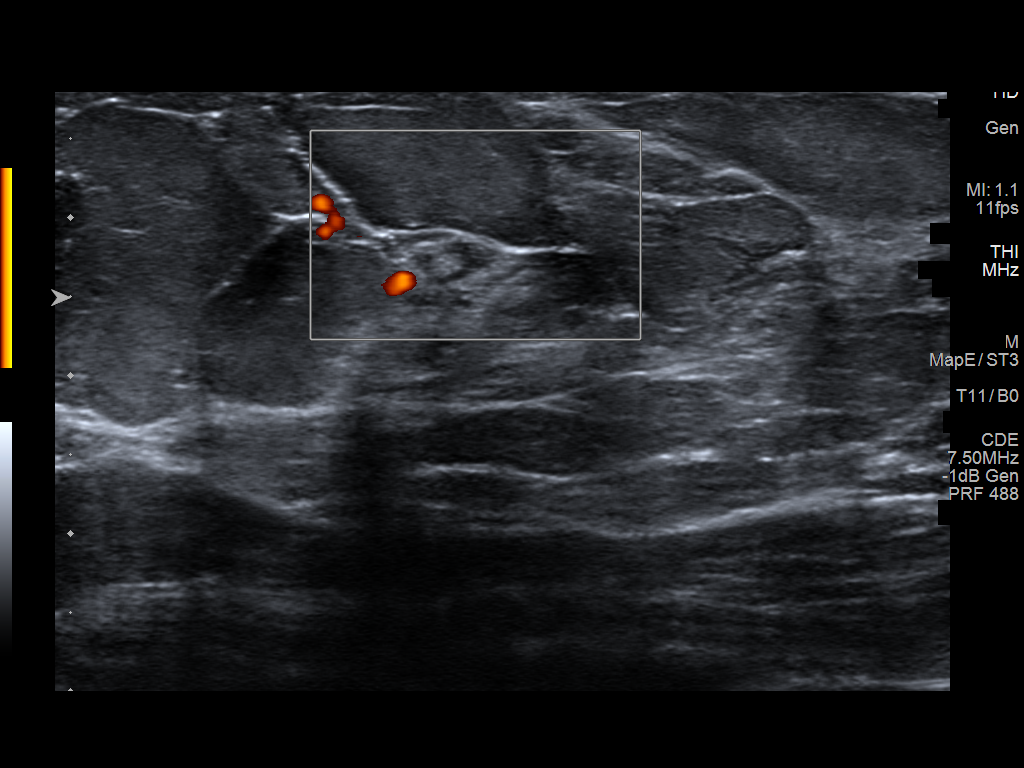
[im 6/6]
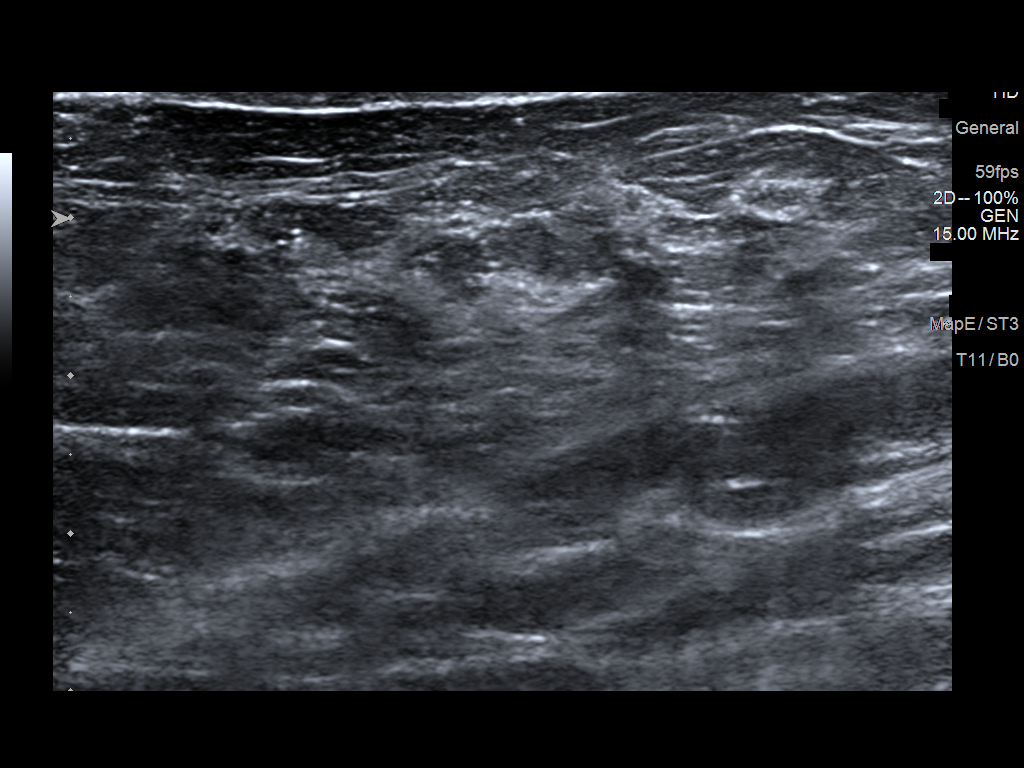

[6 of 6 positions shown; findings below may reference images not displayed]

ACR Breast Density Category b: There are scattered areas of
fibroglandular density.
FINDINGS: There are no new dominant masses, suspicious calcifications or
secondary signs of malignancy within either breast.

Mammographic images were processed with CAD.

LEFT breast: Targeted ultrasound is performed, again showing an oval
circumscribed nearly anechoic mass in the LEFT breast at the 3
o'clock axis, 7 cm from the nipple, measuring 4 x 1 x 3 mm, not
significantly changed compared to previous exams, most compatible
with a small benign complicated cyst.

RIGHT breast: Targeted ultrasound is performed, evaluating the outer
RIGHT breast corresponding to the site of a previously questioned
asymmetry, showing only a benign intramammary lymph node at the 10
o'clock axis and normal fibroglandular tissues throughout the
remainder of the outer RIGHT breast. No suspicious solid or cystic
mass.
IMPRESSION: 1. Probably benign complicated cyst within the LEFT breast at the 3
o'clock axis, 7 cm from the nipple, measuring 4 mm, not
significantly changed compared to previous exams. Recommend
additional follow-up diagnostic mammogram and ultrasound in 12
months to ensure 2 year stability.
2. No evidence of malignancy within the RIGHT breast.

RECOMMENDATION:
Bilateral diagnostic mammogram, and LEFT breast ultrasound, in 12
months.

I have discussed the findings and recommendations with the patient.
If applicable, a reminder letter will be sent to the patient
regarding the next appointment.

BI-RADS CATEGORY  3: Probably benign.

## 2021-01-15 DIAGNOSIS — I10 Essential (primary) hypertension: Secondary | ICD-10-CM | POA: Diagnosis not present

## 2021-03-03 ENCOUNTER — Other Ambulatory Visit: Payer: Self-pay | Admitting: Internal Medicine

## 2021-03-03 DIAGNOSIS — N6002 Solitary cyst of left breast: Secondary | ICD-10-CM

## 2021-03-03 DIAGNOSIS — I10 Essential (primary) hypertension: Secondary | ICD-10-CM | POA: Diagnosis not present

## 2021-03-03 DIAGNOSIS — R7301 Impaired fasting glucose: Secondary | ICD-10-CM | POA: Diagnosis not present

## 2021-03-03 DIAGNOSIS — K219 Gastro-esophageal reflux disease without esophagitis: Secondary | ICD-10-CM | POA: Diagnosis not present

## 2021-03-03 DIAGNOSIS — Z0000 Encounter for general adult medical examination without abnormal findings: Secondary | ICD-10-CM | POA: Diagnosis not present

## 2021-03-03 DIAGNOSIS — E7800 Pure hypercholesterolemia, unspecified: Secondary | ICD-10-CM | POA: Diagnosis not present

## 2021-03-12 DIAGNOSIS — K029 Dental caries, unspecified: Secondary | ICD-10-CM | POA: Diagnosis not present

## 2021-04-17 ENCOUNTER — Encounter: Payer: Self-pay | Admitting: Nurse Practitioner

## 2021-04-17 ENCOUNTER — Other Ambulatory Visit: Payer: Self-pay

## 2021-04-17 ENCOUNTER — Ambulatory Visit (INDEPENDENT_AMBULATORY_CARE_PROVIDER_SITE_OTHER): Payer: BC Managed Care – PPO | Admitting: Nurse Practitioner

## 2021-04-17 VITALS — BP 126/80 | Ht 61.5 in | Wt 107.0 lb

## 2021-04-17 DIAGNOSIS — Z01419 Encounter for gynecological examination (general) (routine) without abnormal findings: Secondary | ICD-10-CM

## 2021-04-17 DIAGNOSIS — M8589 Other specified disorders of bone density and structure, multiple sites: Secondary | ICD-10-CM

## 2021-04-17 DIAGNOSIS — Z79890 Hormone replacement therapy (postmenopausal): Secondary | ICD-10-CM

## 2021-04-17 DIAGNOSIS — Z780 Asymptomatic menopausal state: Secondary | ICD-10-CM

## 2021-04-17 NOTE — Progress Notes (Signed)
Susan Gallegos 16-Sep-1940 [id number]   History:  80 y.o. [id number] presents for breast and pelvic exam. Postmenopausal - on ERT. Has tried to wean but hot flashes are intolerable. S/P 1979 TVH for DUB. Normal pap history. HTN, HLD managed by PCP.   Gynecologic History No LMP recorded. Patient has had a hysterectomy.   Contraception: status post hysterectomy Sexually active: No  Health Maintenance Last Pap: 2012. Results were: Normal Last mammogram: 05/23/2020. Results were: Probable benign left breast cyst, stable Last colonoscopy: Never. Cologuard in the past Last Dexa: 06/13/2019. Results were: T-score -1.2, FRAX 9.4% / 1.9%  Past medical history, past surgical history, family history and social history were all reviewed and documented in the EPIC chart. Married. Son and daughter. 5 grandchildren ages 75-25.   ROS:  A ROS was performed and pertinent positives and negatives are included.  Exam:  Vitals:   04/17/21 1026  BP: 126/80  Weight: 107 lb (48.5 kg)  Height: 5' 1.5" (1.562 m)   Body mass index is 19.89 kg/m.  General appearance:  Normal Thyroid:  Symmetrical, normal in size, without palpable masses or nodularity. Respiratory  Auscultation:  Clear without wheezing or rhonchi Cardiovascular  Auscultation:  Regular rate, without rubs, murmurs or gallops  Edema/varicosities:  Not grossly evident Abdominal  Soft,nontender, without masses, guarding or rebound.  Liver/spleen:  No organomegaly noted  Hernia:  None appreciated  Skin  Inspection:  Grossly normal Breasts: Examined lying and sitting.   Right: Without masses, retractions, nipple discharge or axillary adenopathy.   Left: Without masses, retractions, nipple discharge or axillary adenopathy. Genitourinary   Inguinal/mons:  Normal without inguinal adenopathy  External genitalia:  Normal appearing vulva with no masses, tenderness, or lesions  BUS/Urethra/Skene's glands:  Normal  Vagina:  Atrophic  changes  Cervix:  Absent  Uterus:  Absent  Adnexa/parametria:     Rt: Normal in size, without masses or tenderness.   Lt: Normal in size, without masses or tenderness.  Anus and perineum: Normal  Digital rectal exam: Normal sphincter tone without palpated masses or tenderness  Patient informed chaperone available to be present for breast and pelvic exam. Patient has requested no chaperone to be present. Patient has been advised what will be completed during breast and pelvic exam.   Assessment/Plan:  80 y.o. [id number] for breast and pelvic exam.   Well female exam with routine gynecological exam - Education provided on SBEs, importance of preventative screenings, current guidelines, high calcium diet, regular exercise, and multivitamin daily.  Labs with PCP.   Postmenopausal - Plan: DG Bone Density. On ERT. S/P 1979 TVH for DUB.   Hormone replacement therapy (HRT) - Climara patch 0.025 mg. She has tried to wean but hot flashes are intolerable. She is aware of risk for blood clots, heart attack, stroke, and breast cancer with continued use. She would like to continue. PCP was providing refills since last visit but she plans to have these switched to here with next refill.   Osteopenia of multiple sites - Plan: DG Bone Density. Will repeat bone density now. Continue Vitamin D + Calcium supplement and regular exercise.   Screening for cervical cancer - Normal Pap history. No longer screening per guidelines.   Screening for breast cancer - Stable benign left breast mass, otherwise normal history.  Continue annual screenings.  Normal breast exam today.  Screening for colon cancer - Has never had colonoscopy. Did cologuard in the past.   Return in 1 year for breast and pelvic  exam.   Olivia Mackie DNP, 10:43 AM 04/17/2021

## 2021-05-27 ENCOUNTER — Ambulatory Visit
Admission: RE | Admit: 2021-05-27 | Discharge: 2021-05-27 | Disposition: A | Discharge status: Home / Self Care | Payer: BC Managed Care – PPO | Source: Ambulatory Visit | Attending: Internal Medicine | Admitting: Internal Medicine

## 2021-05-27 ENCOUNTER — Other Ambulatory Visit: Payer: Self-pay

## 2021-05-27 DIAGNOSIS — N6002 Solitary cyst of left breast: Secondary | ICD-10-CM

## 2021-05-27 DIAGNOSIS — R922 Inconclusive mammogram: Secondary | ICD-10-CM | POA: Diagnosis not present

## 2021-06-25 ENCOUNTER — Ambulatory Visit (INDEPENDENT_AMBULATORY_CARE_PROVIDER_SITE_OTHER): Payer: BC Managed Care – PPO

## 2021-06-25 ENCOUNTER — Other Ambulatory Visit: Payer: Self-pay

## 2021-06-25 ENCOUNTER — Other Ambulatory Visit: Payer: Self-pay | Admitting: Nurse Practitioner

## 2021-06-25 DIAGNOSIS — Z780 Asymptomatic menopausal state: Secondary | ICD-10-CM

## 2021-06-25 DIAGNOSIS — M8589 Other specified disorders of bone density and structure, multiple sites: Secondary | ICD-10-CM | POA: Diagnosis not present

## 2021-08-05 DIAGNOSIS — R0981 Nasal congestion: Secondary | ICD-10-CM | POA: Diagnosis not present

## 2021-08-05 DIAGNOSIS — J019 Acute sinusitis, unspecified: Secondary | ICD-10-CM | POA: Diagnosis not present

## 2021-08-05 DIAGNOSIS — R059 Cough, unspecified: Secondary | ICD-10-CM | POA: Diagnosis not present

## 2021-08-20 DIAGNOSIS — L821 Other seborrheic keratosis: Secondary | ICD-10-CM | POA: Diagnosis not present

## 2021-08-20 DIAGNOSIS — L570 Actinic keratosis: Secondary | ICD-10-CM | POA: Diagnosis not present

## 2021-08-20 DIAGNOSIS — L814 Other melanin hyperpigmentation: Secondary | ICD-10-CM | POA: Diagnosis not present

## 2021-08-20 DIAGNOSIS — D225 Melanocytic nevi of trunk: Secondary | ICD-10-CM | POA: Diagnosis not present

## 2021-08-20 DIAGNOSIS — D2272 Melanocytic nevi of left lower limb, including hip: Secondary | ICD-10-CM | POA: Diagnosis not present

## 2021-09-11 DIAGNOSIS — I10 Essential (primary) hypertension: Secondary | ICD-10-CM | POA: Diagnosis not present

## 2021-09-11 DIAGNOSIS — K219 Gastro-esophageal reflux disease without esophagitis: Secondary | ICD-10-CM | POA: Diagnosis not present

## 2021-09-11 DIAGNOSIS — R0789 Other chest pain: Secondary | ICD-10-CM | POA: Diagnosis not present

## 2021-12-01 IMAGING — MG DIGITAL DIAGNOSTIC BILAT W/ TOMO W/ CAD
6 of 10 series · 6 of 30 positions shown · non-contrast
Comparison: Previous exam(s).

CLINICAL DATA: 80-year-old female presenting for follow-up of a
likely benign left breast mass.

EXAM:
DIGITAL DIAGNOSTIC BILATERAL MAMMOGRAM WITH TOMOSYNTHESIS AND CAD;
ULTRASOUND LEFT BREAST LIMITED
TECHNIQUE: Bilateral digital diagnostic mammography and breast tomosynthesis
was performed. The images were evaluated with computer-aided
detection.; Targeted ultrasound examination of the left breast was
performed.

[L CC synth-2D]
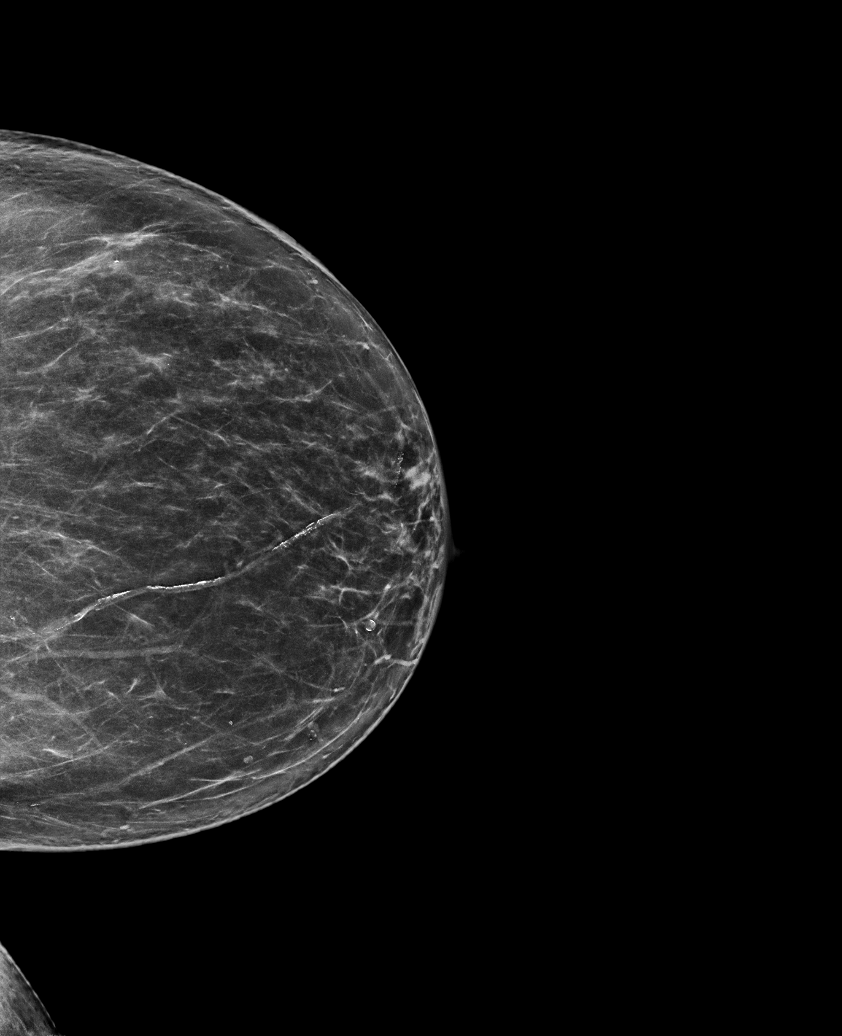

[R MLO synth-2D]
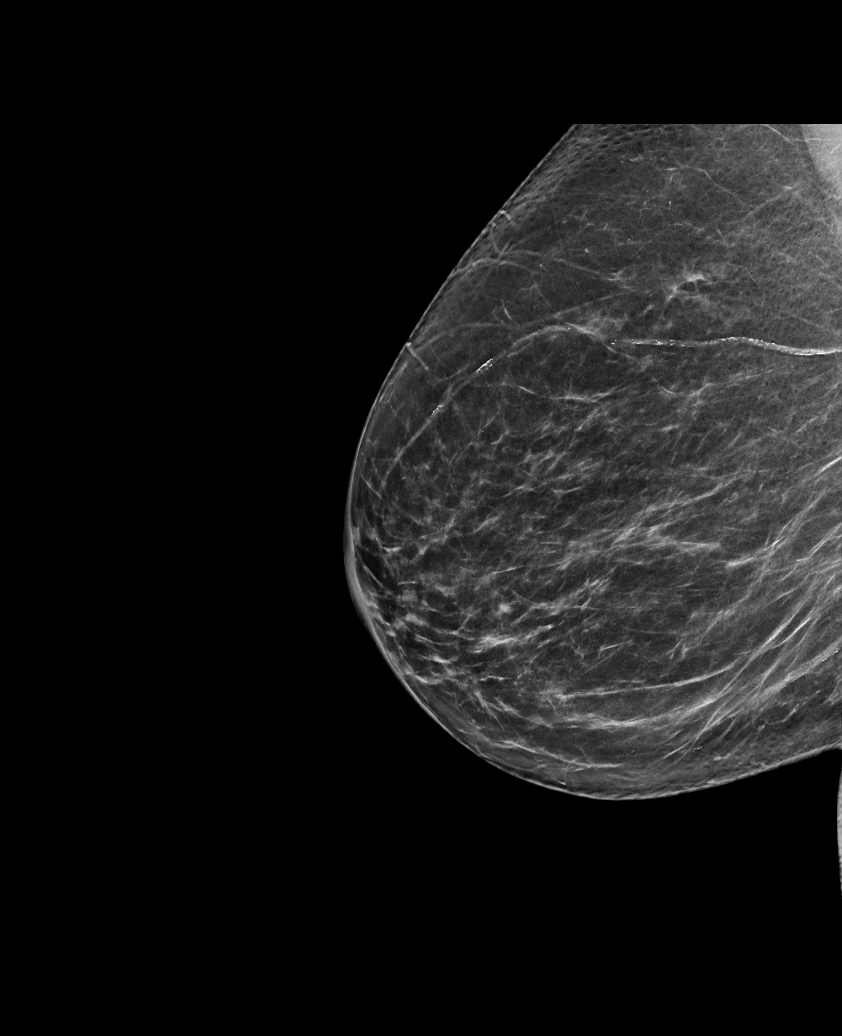

[R CC synth-2D]
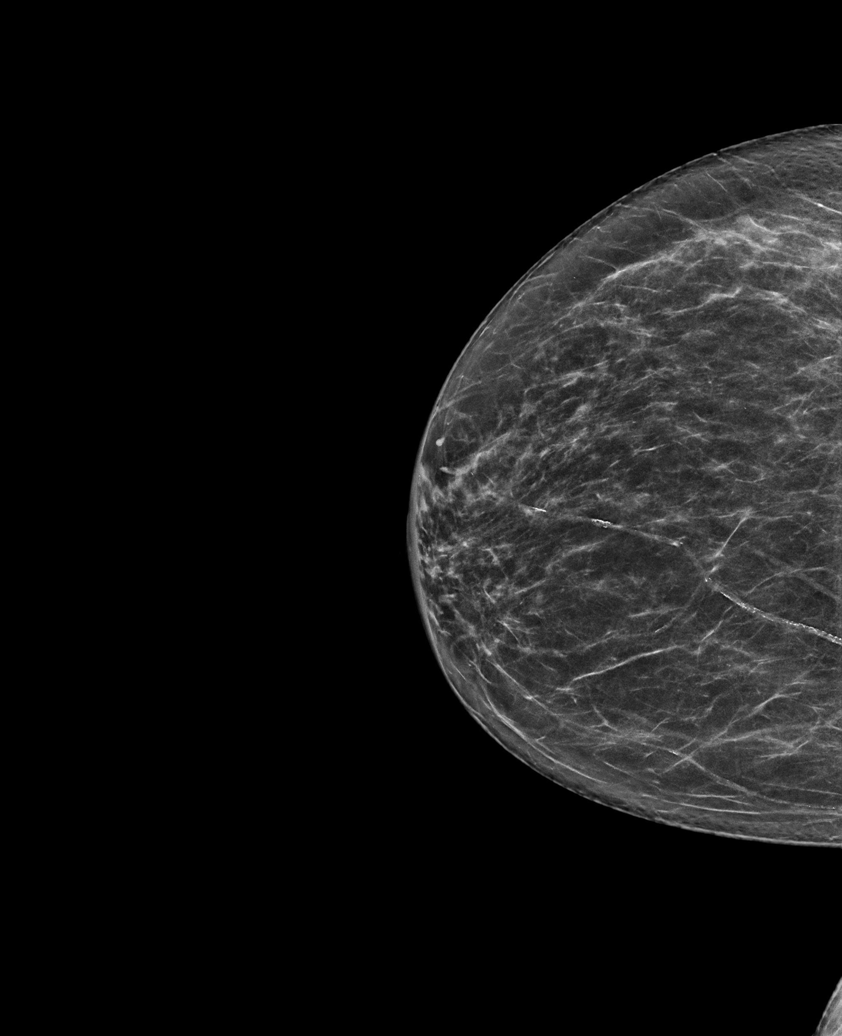

[L MLO synth-2D (1 of 2)]
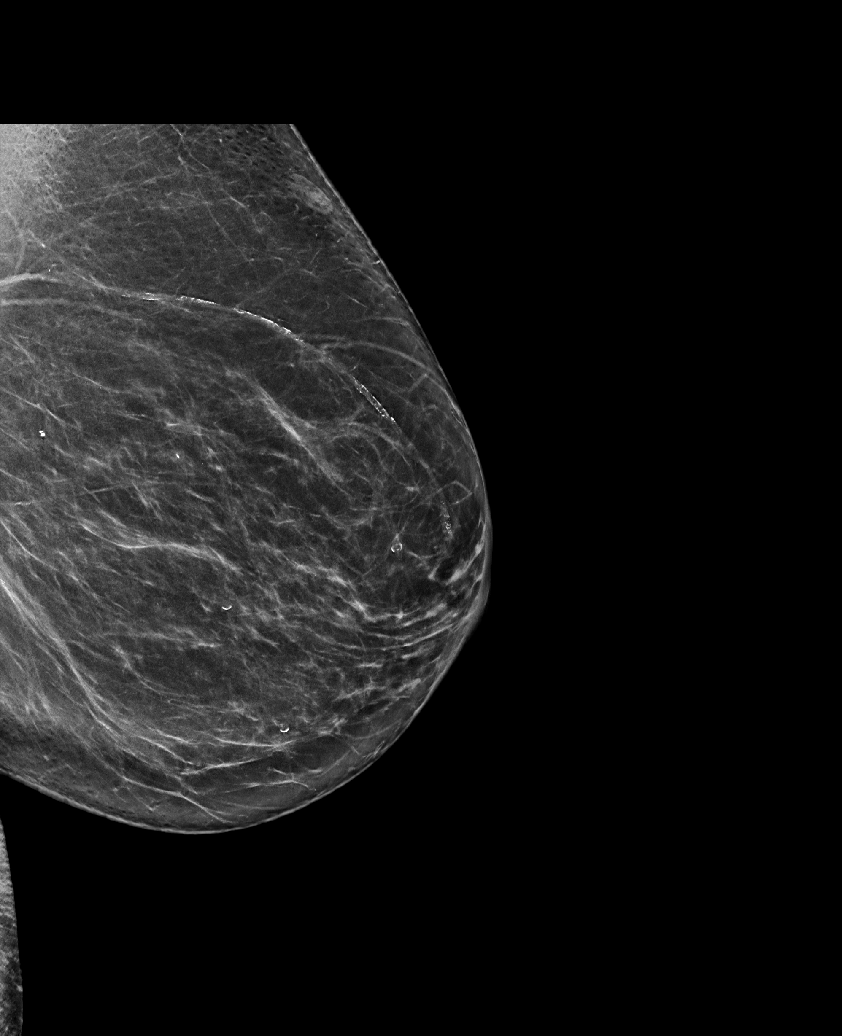

[L MLO synth-2D (2 of 2)]
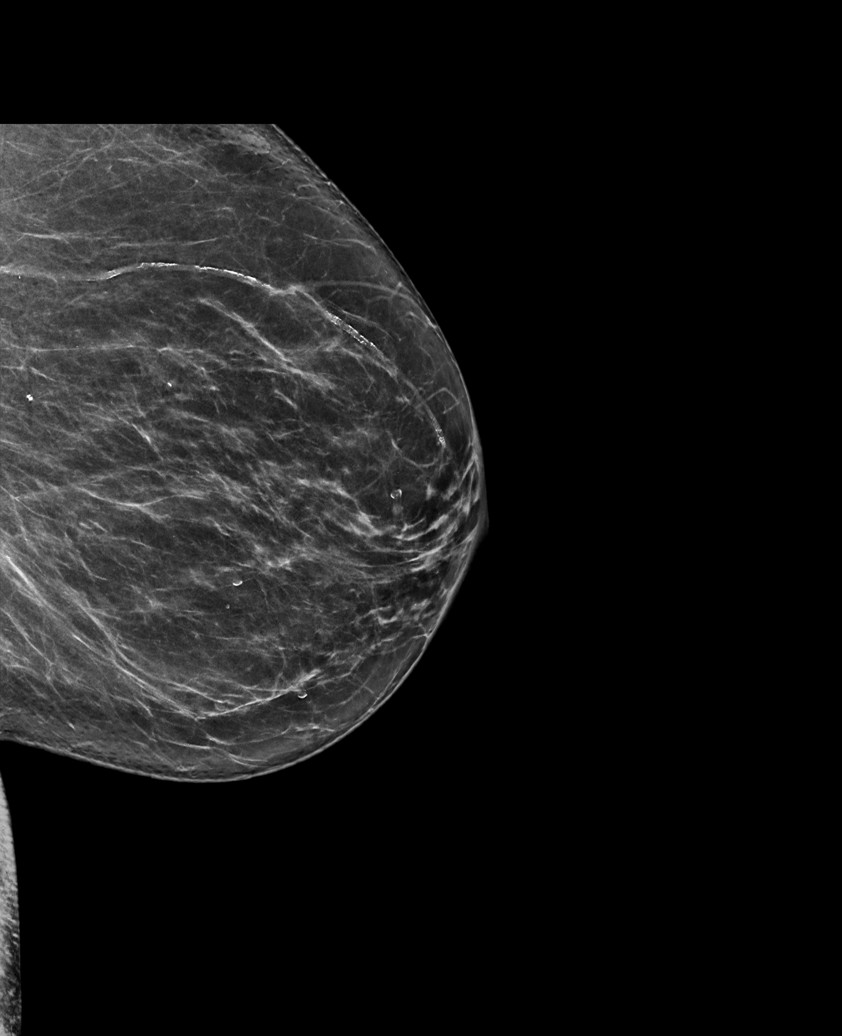

[L CC tomo · tomo slice 38/75.0]
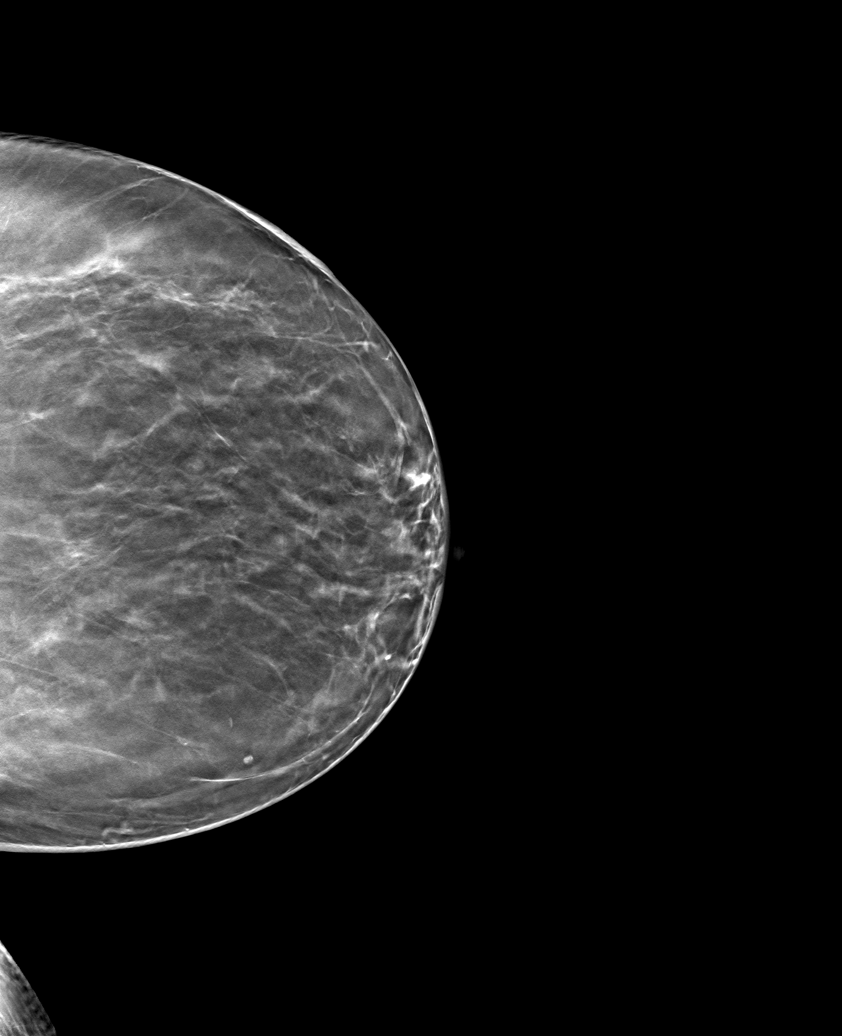

[6 of 30 positions shown; findings below may reference images not displayed]

ACR Breast Density Category b: There are scattered areas of
fibroglandular density.
FINDINGS: No suspicious calcifications, masses or areas of distortion are seen
in the bilateral breasts.

Ultrasound targeted to the left breast at 3 o'clock, 7 cm from
nipple demonstrates a stable oval hypoechoic mass measuring 3 x 1 x
3 mm, previously 4 x 1 x 3 mm.
IMPRESSION: 1. The likely benign left breast mass at 3 o'clock has demonstrated
2 years of stability, and is therefore benign.

2.  No mammographic evidence of malignancy in the bilateral breasts.

RECOMMENDATION:
Screening mammogram in one year.(Code:[id number])

I have discussed the findings and recommendations with the patient.
If applicable, a reminder letter will be sent to the patient
regarding the next appointment.

BI-RADS CATEGORY  2: Benign.

## 2022-03-12 DIAGNOSIS — K219 Gastro-esophageal reflux disease without esophagitis: Secondary | ICD-10-CM | POA: Diagnosis not present

## 2022-03-12 DIAGNOSIS — I10 Essential (primary) hypertension: Secondary | ICD-10-CM | POA: Diagnosis not present

## 2022-03-12 DIAGNOSIS — R7301 Impaired fasting glucose: Secondary | ICD-10-CM | POA: Diagnosis not present

## 2022-03-12 DIAGNOSIS — Z0000 Encounter for general adult medical examination without abnormal findings: Secondary | ICD-10-CM | POA: Diagnosis not present

## 2022-03-12 DIAGNOSIS — H9190 Unspecified hearing loss, unspecified ear: Secondary | ICD-10-CM | POA: Diagnosis not present

## 2022-03-16 DIAGNOSIS — Z23 Encounter for immunization: Secondary | ICD-10-CM | POA: Diagnosis not present

## 2022-03-16 DIAGNOSIS — H6122 Impacted cerumen, left ear: Secondary | ICD-10-CM | POA: Diagnosis not present

## 2022-04-20 ENCOUNTER — Ambulatory Visit (INDEPENDENT_AMBULATORY_CARE_PROVIDER_SITE_OTHER): Payer: Medicare Other | Admitting: Nurse Practitioner

## 2022-04-20 ENCOUNTER — Encounter: Payer: Self-pay | Admitting: Nurse Practitioner

## 2022-04-20 VITALS — BP 132/82 | HR 83 | Ht 60.5 in | Wt 105.0 lb

## 2022-04-20 DIAGNOSIS — Z01419 Encounter for gynecological examination (general) (routine) without abnormal findings: Secondary | ICD-10-CM | POA: Diagnosis not present

## 2022-04-20 DIAGNOSIS — Z79890 Hormone replacement therapy (postmenopausal): Secondary | ICD-10-CM | POA: Diagnosis not present

## 2022-04-20 DIAGNOSIS — M8589 Other specified disorders of bone density and structure, multiple sites: Secondary | ICD-10-CM | POA: Diagnosis not present

## 2022-04-20 NOTE — Progress Notes (Signed)
Susan Gallegos 26-Aug-1940 [id number]   History:  81 y.o. [id number] presents for breast and pelvic exam. Postmenopausal - on ERT. Patches prescribed by PCP the last few years but they have retired and she would like prescribed here. S/P 1979 TVH for DUB. Normal pap history. HTN, HLD managed by PCP.   Gynecologic History No LMP recorded. Patient has had a hysterectomy.   Contraception: status post hysterectomy Sexually active: No  Health Maintenance Last Pap: 2012. Results were: Normal Last mammogram: 05/27/2021. Results were: Stable benign left breast cyst Last colonoscopy: Never. Cologuard in the past Last Dexa: 06/25/2021. Results were: T-score -1.4, FRAX 10% / 2.4%  Past medical history, past surgical history, family history and social history were all reviewed and documented in the EPIC chart. Married. Son and daughter. 5 grandchildren ages 31-26.   ROS:  A ROS was performed and pertinent positives and negatives are included.  Exam:  Vitals:   04/20/22 1038  BP: 132/82  Pulse: 83  SpO2: 97%  Weight: 105 lb (47.6 kg)  Height: 5' 0.5" (1.537 m)    Body mass index is 20.17 kg/m.  General appearance:  Normal Thyroid:  Symmetrical, normal in size, without palpable masses or nodularity. Respiratory  Auscultation:  Clear without wheezing or rhonchi Cardiovascular  Auscultation:  Regular rate, without rubs, murmurs or gallops  Edema/varicosities:  Not grossly evident Abdominal  Soft,nontender, without masses, guarding or rebound.  Liver/spleen:  No organomegaly noted  Hernia:  None appreciated  Skin  Inspection:  Grossly normal Breasts: Examined lying and sitting.   Right: Without masses, retractions, nipple discharge or axillary adenopathy.   Left: Without masses, retractions, nipple discharge or axillary adenopathy. Genitourinary   Inguinal/mons:  Normal without inguinal adenopathy  External genitalia:  Normal appearing vulva with no masses, tenderness, or  lesions  BUS/Urethra/Skene's glands:  Normal  Vagina:  Atrophic changes  Cervix:  Absent  Uterus:  Absent  Adnexa/parametria:     Rt: Normal in size, without masses or tenderness.   Lt: Normal in size, without masses or tenderness.  Anus and perineum: Normal  Digital rectal exam: Normal sphincter tone without palpated masses or tenderness  Patient informed chaperone available to be present for breast and pelvic exam. Patient has requested no chaperone to be present. Patient has been advised what will be completed during breast and pelvic exam.   Assessment/Plan:  81 y.o. [id number] for breast and pelvic exam.   Well female exam with routine gynecological exam - Education provided on SBEs, importance of preventative screenings, current guidelines, high calcium diet, regular exercise, and multivitamin daily.  Labs with PCP.   Hormone replacement therapy (HRT) - Climara patch 0.025 mg weekly. She tried to wean 2-3 years ago but did not tolerate hot flashes. Recommend trying to wean every 6-12 months and she is agreeable. Plans to cut patch in half to see how she does. She is aware of risk for blood clots, heart attack, stroke, and breast cancer with continued use. Will reach out if she does not tolerate stopping.   Osteopenia of multiple sites - T-score -1.4 last December. Will repeat DXA next December. Continue Vitamin D + Calcium supplement and regular exercise.   Screening for cervical cancer - Normal Pap history. No longer screening per guidelines.   Screening for breast cancer - Stable benign left breast mass, otherwise normal history.  Continue annual screenings.  Normal breast exam today.  Screening for colon cancer - Has never had colonoscopy. Did cologuard in the  past.   Return in 1 year for medication follow up if she continues ERT, otherwise 2 years for breast and pelvic exam.      Olivia Mackie DNP, 11:21 AM 04/20/2022

## 2022-06-08 ENCOUNTER — Other Ambulatory Visit: Payer: Self-pay | Admitting: *Deleted

## 2022-06-08 DIAGNOSIS — Z79890 Hormone replacement therapy (postmenopausal): Secondary | ICD-10-CM

## 2022-06-08 NOTE — Telephone Encounter (Signed)
Med refill request: Patient left message requesting 3 mo supply of Estradiol 0.025 mg patch to [facility] [address]   Last AEX: 04/20/22 /TW Next AEX: Not scheduled  Last MMG (if hormonal med) 05/27/21 -Bilateral Dx MMG and Left breast US, Bi -Rads 2, benign, screening 1 yr; not scheduled  Refill authorized: Please Advise?

## 2022-06-09 MED ORDER — ESTRADIOL 0.025 MG/24HR TD PTWK: MEDICATED_PATCH | TRANSDERMAL | 0 refills | Status: DC

## 2022-09-16 DIAGNOSIS — M8589 Other specified disorders of bone density and structure, multiple sites: Secondary | ICD-10-CM | POA: Diagnosis not present

## 2022-09-16 DIAGNOSIS — I10 Essential (primary) hypertension: Secondary | ICD-10-CM | POA: Diagnosis not present

## 2022-09-16 DIAGNOSIS — R7303 Prediabetes: Secondary | ICD-10-CM | POA: Diagnosis not present

## 2022-09-16 DIAGNOSIS — K219 Gastro-esophageal reflux disease without esophagitis: Secondary | ICD-10-CM | POA: Diagnosis not present

## 2022-09-16 DIAGNOSIS — M542 Cervicalgia: Secondary | ICD-10-CM | POA: Diagnosis not present

## 2022-10-19 ENCOUNTER — Other Ambulatory Visit: Payer: Self-pay

## 2022-10-19 DIAGNOSIS — Z79890 Hormone replacement therapy (postmenopausal): Secondary | ICD-10-CM

## 2022-10-19 NOTE — Telephone Encounter (Signed)
Patient brought by and left at front desk a label off her estradiol patch and requested refill.  Rx #[id number] [facility].  AEX was 04/20/2022.

## 2022-10-20 MED ORDER — ESTRADIOL 0.025 MG/24HR TD PTWK: MEDICATED_PATCH | TRANSDERMAL | 1 refills | Status: DC

## 2022-12-30 DIAGNOSIS — D2271 Melanocytic nevi of right lower limb, including hip: Secondary | ICD-10-CM | POA: Diagnosis not present

## 2022-12-30 DIAGNOSIS — L821 Other seborrheic keratosis: Secondary | ICD-10-CM | POA: Diagnosis not present

## 2022-12-30 DIAGNOSIS — D2272 Melanocytic nevi of left lower limb, including hip: Secondary | ICD-10-CM | POA: Diagnosis not present

## 2022-12-30 DIAGNOSIS — D1801 Hemangioma of skin and subcutaneous tissue: Secondary | ICD-10-CM | POA: Diagnosis not present

## 2022-12-30 DIAGNOSIS — D225 Melanocytic nevi of trunk: Secondary | ICD-10-CM | POA: Diagnosis not present

## 2022-12-30 DIAGNOSIS — Q828 Other specified congenital malformations of skin: Secondary | ICD-10-CM | POA: Diagnosis not present

## 2022-12-30 DIAGNOSIS — L570 Actinic keratosis: Secondary | ICD-10-CM | POA: Diagnosis not present

## 2022-12-30 DIAGNOSIS — L814 Other melanin hyperpigmentation: Secondary | ICD-10-CM | POA: Diagnosis not present

## 2022-12-30 DIAGNOSIS — D1724 Benign lipomatous neoplasm of skin and subcutaneous tissue of left leg: Secondary | ICD-10-CM | POA: Diagnosis not present

## 2023-04-22 DIAGNOSIS — I7300 Raynaud's syndrome without gangrene: Secondary | ICD-10-CM | POA: Diagnosis not present

## 2023-04-22 DIAGNOSIS — Z23 Encounter for immunization: Secondary | ICD-10-CM | POA: Diagnosis not present

## 2023-04-22 DIAGNOSIS — Z0000 Encounter for general adult medical examination without abnormal findings: Secondary | ICD-10-CM | POA: Diagnosis not present

## 2023-04-22 DIAGNOSIS — M8589 Other specified disorders of bone density and structure, multiple sites: Secondary | ICD-10-CM | POA: Diagnosis not present

## 2023-04-22 DIAGNOSIS — I10 Essential (primary) hypertension: Secondary | ICD-10-CM | POA: Diagnosis not present

## 2023-04-22 DIAGNOSIS — Z79899 Other long term (current) drug therapy: Secondary | ICD-10-CM | POA: Diagnosis not present

## 2023-04-22 DIAGNOSIS — K219 Gastro-esophageal reflux disease without esophagitis: Secondary | ICD-10-CM | POA: Diagnosis not present

## 2023-04-22 DIAGNOSIS — R7303 Prediabetes: Secondary | ICD-10-CM | POA: Diagnosis not present

## 2023-04-22 DIAGNOSIS — M542 Cervicalgia: Secondary | ICD-10-CM | POA: Diagnosis not present

## 2023-05-04 ENCOUNTER — Other Ambulatory Visit: Payer: Self-pay | Admitting: Internal Medicine

## 2023-05-04 DIAGNOSIS — Z1231 Encounter for screening mammogram for malignant neoplasm of breast: Secondary | ICD-10-CM

## 2023-05-13 DIAGNOSIS — J45901 Unspecified asthma with (acute) exacerbation: Secondary | ICD-10-CM | POA: Diagnosis not present

## 2023-05-13 DIAGNOSIS — J0100 Acute maxillary sinusitis, unspecified: Secondary | ICD-10-CM | POA: Diagnosis not present

## 2023-05-13 DIAGNOSIS — J209 Acute bronchitis, unspecified: Secondary | ICD-10-CM | POA: Diagnosis not present

## 2023-06-10 ENCOUNTER — Ambulatory Visit: Payer: BC Managed Care – PPO

## 2023-06-15 ENCOUNTER — Ambulatory Visit
Admission: RE | Admit: 2023-06-15 | Discharge: 2023-06-15 | Disposition: A | Discharge status: Home / Self Care | Payer: Medicare Other | Source: Ambulatory Visit | Attending: Internal Medicine | Admitting: Internal Medicine

## 2023-06-15 DIAGNOSIS — Z1231 Encounter for screening mammogram for malignant neoplasm of breast: Secondary | ICD-10-CM

## 2023-07-02 ENCOUNTER — Other Ambulatory Visit: Payer: Self-pay | Admitting: Nurse Practitioner

## 2023-07-02 DIAGNOSIS — Z79890 Hormone replacement therapy (postmenopausal): Secondary | ICD-10-CM

## 2023-07-02 NOTE — Telephone Encounter (Signed)
Med refill request: estradiol 0.025 mg patch Last AEX: 04/20/22 B&P Next AEX: none scheduled Last MMG (if hormonal med) 06/15/23 BI-RADS 1 Refill sent to provider for approval or denial as appropriate.

## 2023-09-30 DIAGNOSIS — H43393 Other vitreous opacities, bilateral: Secondary | ICD-10-CM | POA: Diagnosis not present

## 2023-10-25 DIAGNOSIS — K219 Gastro-esophageal reflux disease without esophagitis: Secondary | ICD-10-CM | POA: Diagnosis not present

## 2023-10-25 DIAGNOSIS — R7303 Prediabetes: Secondary | ICD-10-CM | POA: Diagnosis not present

## 2023-10-25 DIAGNOSIS — Z23 Encounter for immunization: Secondary | ICD-10-CM | POA: Diagnosis not present

## 2023-10-25 DIAGNOSIS — I10 Essential (primary) hypertension: Secondary | ICD-10-CM | POA: Diagnosis not present

## 2023-12-04 DIAGNOSIS — I10 Essential (primary) hypertension: Secondary | ICD-10-CM | POA: Diagnosis not present

## 2024-01-03 DIAGNOSIS — I10 Essential (primary) hypertension: Secondary | ICD-10-CM | POA: Diagnosis not present

## 2024-03-28 DIAGNOSIS — L718 Other rosacea: Secondary | ICD-10-CM | POA: Diagnosis not present

## 2024-03-28 DIAGNOSIS — Q828 Other specified congenital malformations of skin: Secondary | ICD-10-CM | POA: Diagnosis not present

## 2024-03-28 DIAGNOSIS — L821 Other seborrheic keratosis: Secondary | ICD-10-CM | POA: Diagnosis not present

## 2024-03-28 DIAGNOSIS — D1724 Benign lipomatous neoplasm of skin and subcutaneous tissue of left leg: Secondary | ICD-10-CM | POA: Diagnosis not present

## 2024-03-28 DIAGNOSIS — D2271 Melanocytic nevi of right lower limb, including hip: Secondary | ICD-10-CM | POA: Diagnosis not present

## 2024-03-28 DIAGNOSIS — L570 Actinic keratosis: Secondary | ICD-10-CM | POA: Diagnosis not present

## 2024-03-28 DIAGNOSIS — D2272 Melanocytic nevi of left lower limb, including hip: Secondary | ICD-10-CM | POA: Diagnosis not present

## 2024-04-27 ENCOUNTER — Encounter: Admitting: Nurse Practitioner

## 2024-04-27 DIAGNOSIS — M8589 Other specified disorders of bone density and structure, multiple sites: Secondary | ICD-10-CM | POA: Diagnosis not present

## 2024-04-27 DIAGNOSIS — K219 Gastro-esophageal reflux disease without esophagitis: Secondary | ICD-10-CM | POA: Diagnosis not present

## 2024-04-27 DIAGNOSIS — I10 Essential (primary) hypertension: Secondary | ICD-10-CM | POA: Diagnosis not present

## 2024-04-27 DIAGNOSIS — R7303 Prediabetes: Secondary | ICD-10-CM | POA: Diagnosis not present

## 2024-04-27 DIAGNOSIS — Z79899 Other long term (current) drug therapy: Secondary | ICD-10-CM | POA: Diagnosis not present

## 2024-04-27 DIAGNOSIS — Z23 Encounter for immunization: Secondary | ICD-10-CM | POA: Diagnosis not present

## 2024-04-27 DIAGNOSIS — Z0000 Encounter for general adult medical examination without abnormal findings: Secondary | ICD-10-CM | POA: Diagnosis not present

## 2024-04-28 ENCOUNTER — Ambulatory Visit (INDEPENDENT_AMBULATORY_CARE_PROVIDER_SITE_OTHER): Admitting: Nurse Practitioner

## 2024-04-28 ENCOUNTER — Encounter: Payer: Self-pay | Admitting: Nurse Practitioner

## 2024-04-28 VITALS — BP 110/66 | HR 83 | Ht 60.0 in | Wt 106.0 lb

## 2024-04-28 DIAGNOSIS — M8589 Other specified disorders of bone density and structure, multiple sites: Secondary | ICD-10-CM | POA: Diagnosis not present

## 2024-04-28 DIAGNOSIS — Z1211 Encounter for screening for malignant neoplasm of colon: Secondary | ICD-10-CM

## 2024-04-28 DIAGNOSIS — Z01419 Encounter for gynecological examination (general) (routine) without abnormal findings: Secondary | ICD-10-CM | POA: Diagnosis not present

## 2024-04-28 DIAGNOSIS — Z79890 Hormone replacement therapy (postmenopausal): Secondary | ICD-10-CM | POA: Diagnosis not present

## 2024-04-28 DIAGNOSIS — N8110 Cystocele, unspecified: Secondary | ICD-10-CM | POA: Diagnosis not present

## 2024-04-28 MED ORDER — ESTRADIOL 0.025 MG/24HR TD PTWK: MEDICATED_PATCH | TRANSDERMAL | 3 refills | Status: AC

## 2024-04-28 NOTE — Progress Notes (Signed)
 Susan Gallegos 06-Feb-1941 [id number]   History:  83 y.o. [id number] presents for breast and pelvic exam. Postmenopausal - on ERT. Patches prescribed by PCP the last few years but they have retired and she would like prescribed here. S/P 1979 TVH for DUB. Normal pap history. HTN, HLD managed by PCP.   Gynecologic History No LMP recorded. Patient has had a hysterectomy.   Contraception: status post hysterectomy Sexually active: No  Health Maintenance Last Pap: 2012. Results were: Normal Last mammogram: 06/15/2023. Results were: Normal Last colonoscopy: Never. Cologuard in the past Last Dexa: 06/25/2021. Results were: T-score -1.4, FRAX 10% / 2.4%  Past medical history, past surgical history, family history and social history were all reviewed and documented in the EPIC chart. Married. Son and daughter. 5 grandchildren ages 85-26.   ROS:  A ROS was performed and pertinent positives and negatives are included.  Exam:  Vitals:   04/28/24 1002  BP: 110/66  Pulse: 83  SpO2: 99%  Weight: 106 lb (48.1 kg)  Height: 5' (1.524 m)     Body mass index is 20.7 kg/m.  General appearance:  Normal Thyroid:  Symmetrical, normal in size, without palpable masses or nodularity. Respiratory  Auscultation:  Clear without wheezing or rhonchi Cardiovascular  Auscultation:  Regular rate, without rubs, murmurs or gallops  Edema/varicosities:  Not grossly evident Abdominal  Soft,nontender, without masses, guarding or rebound.  Liver/spleen:  No organomegaly noted  Hernia:  None appreciated  Skin  Inspection:  Grossly normal Breasts: Examined lying and sitting.   Right: Without masses, retractions, nipple discharge or axillary adenopathy.   Left: Without masses, retractions, nipple discharge or axillary adenopathy. Pelvic: External genitalia:  no lesions              Urethra:  normal appearing urethra with no masses, tenderness or lesions              Bartholins and Skenes: normal                  Vagina: Grade 2-3 cystocele. Normal color and discharge, no lesions              Cervix: absent Bimanual Exam:  Uterus: absent              Adnexa: no mass, fullness, tenderness              Rectovaginal: Deferred              Anus:  normal, no lesions  Geni Pica, CMA present as chaperone.   Assessment/Plan:  83 y.o. [id number] for breast and pelvic exam.   Encounter for breast and pelvic examination - Education provided on SBEs, importance of preventative screenings, current guidelines, high calcium  diet, regular exercise, and multivitamin daily.  Labs with PCP.   Hormone replacement therapy (HRT) - Climara  patch 0.025 mg weekly. Has not tolerated discontinuation due to hot flashes.   Osteopenia of multiple sites - Plan: DG Bone Density. T-score -1.4 without elevated FRAX. Continue Vitamin D + Calcium  supplement and regular exercise.   Screening for colon cancer - Plan: Cologuard. Has never done colonoscopy and has not had screening in years per patient. If normal Cologuard, can stop screenings.   Baden-Walker grade 3 cystocele - Denies urinary incontinence but does feel like she does not fully empty her bladder at times. Educated on causes, S/S and management options. Wants to try at home exercises but will reach out if she decides to pursue PFPT in  the future.   Screening for cervical cancer - Normal Pap history. No longer screening per guidelines.   Screening for breast cancer - Continue annual screenings.  Normal breast exam today.  Return in about 1 year (around 04/28/2025) for Med follow up.      Annabella DELENA Shutter DNP, 10:30 AM 04/28/2024

## 2024-05-02 ENCOUNTER — Telehealth (HOSPITAL_BASED_OUTPATIENT_CLINIC_OR_DEPARTMENT_OTHER): Payer: Self-pay

## 2024-06-12 ENCOUNTER — Encounter: Payer: Self-pay | Admitting: Nurse Practitioner

## 2024-06-20 ENCOUNTER — Ambulatory Visit (HOSPITAL_BASED_OUTPATIENT_CLINIC_OR_DEPARTMENT_OTHER)
Admission: RE | Admit: 2024-06-20 | Discharge: 2024-06-20 | Disposition: A | Source: Ambulatory Visit | Attending: Nurse Practitioner | Admitting: Nurse Practitioner

## 2024-06-20 DIAGNOSIS — M8589 Other specified disorders of bone density and structure, multiple sites: Secondary | ICD-10-CM

## 2024-06-21 ENCOUNTER — Ambulatory Visit: Payer: Self-pay | Admitting: Nurse Practitioner

## 2024-07-28 ENCOUNTER — Other Ambulatory Visit (HOSPITAL_COMMUNITY): Payer: Self-pay

## 2025-05-01 ENCOUNTER — Ambulatory Visit: Admitting: Nurse Practitioner
